# Patient Record
Sex: Female | Born: 1990
Health system: Southern US, Community
[De-identification: ages and names within clinical notes are randomized; demographics above are authoritative.]

## PROBLEM LIST (undated history)

## (undated) ENCOUNTER — Inpatient Hospital Stay: Payer: Self-pay

## (undated) DIAGNOSIS — N83209 Unspecified ovarian cyst, unspecified side: Secondary | ICD-10-CM

## (undated) DIAGNOSIS — Z1371 Encounter for nonprocreative screening for genetic disease carrier status: Secondary | ICD-10-CM

## (undated) DIAGNOSIS — N809 Endometriosis, unspecified: Secondary | ICD-10-CM

## (undated) HISTORY — PX: IUD REMOVAL: SHX5392

## (undated) HISTORY — DX: Encounter for nonprocreative screening for genetic disease carrier status: Z13.71

---
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
## 2009-12-11 ENCOUNTER — Emergency Department: Payer: Self-pay | Admitting: Emergency Medicine

## 2009-12-29 ENCOUNTER — Emergency Department: Payer: Self-pay | Admitting: Internal Medicine

## 2010-05-13 ENCOUNTER — Emergency Department: Payer: Self-pay | Admitting: Emergency Medicine

## 2010-09-17 ENCOUNTER — Emergency Department: Payer: Self-pay | Admitting: Emergency Medicine

## 2010-09-18 ENCOUNTER — Emergency Department: Payer: Self-pay | Admitting: Emergency Medicine

## 2011-01-23 ENCOUNTER — Emergency Department: Payer: Self-pay | Admitting: Emergency Medicine

## 2011-03-28 ENCOUNTER — Emergency Department: Payer: Self-pay | Admitting: *Deleted

## 2011-04-23 ENCOUNTER — Emergency Department: Payer: Self-pay | Admitting: Emergency Medicine

## 2011-04-23 ENCOUNTER — Emergency Department: Payer: Self-pay

## 2011-06-17 ENCOUNTER — Emergency Department: Payer: Self-pay | Admitting: Emergency Medicine

## 2011-06-24 ENCOUNTER — Inpatient Hospital Stay: Payer: Self-pay | Admitting: Internal Medicine

## 2011-09-10 ENCOUNTER — Emergency Department: Payer: Self-pay

## 2011-09-10 LAB — URINALYSIS, COMPLETE
Bilirubin,UR: NEGATIVE
Glucose,UR: NEGATIVE mg/dL (ref 0–75)
Leukocyte Esterase: NEGATIVE
Ph: 7 (ref 4.5–8.0)
Protein: NEGATIVE
RBC,UR: 1 /HPF (ref 0–5)
Squamous Epithelial: 3

## 2011-09-10 LAB — CBC
HCT: 37.3 % (ref 35.0–47.0)
HGB: 12.4 g/dL (ref 12.0–16.0)
MCH: 30.3 pg (ref 26.0–34.0)
MCHC: 33.4 g/dL (ref 32.0–36.0)
MCV: 91 fL (ref 80–100)
Platelet: 361 10*3/uL (ref 150–440)
RBC: 4.11 10*6/uL (ref 3.80–5.20)
RDW: 13.6 % (ref 11.5–14.5)
WBC: 7.3 10*3/uL (ref 3.6–11.0)

## 2011-09-10 LAB — COMPREHENSIVE METABOLIC PANEL
Albumin: 3.8 g/dL (ref 3.4–5.0)
Alkaline Phosphatase: 36 U/L — ABNORMAL LOW (ref 50–136)
Anion Gap: 12 (ref 7–16)
BUN: 5 mg/dL — ABNORMAL LOW (ref 7–18)
Bilirubin,Total: 0.5 mg/dL (ref 0.2–1.0)
Calcium, Total: 8.9 mg/dL (ref 8.5–10.1)
Chloride: 105 mmol/L (ref 98–107)
Co2: 24 mmol/L (ref 21–32)
Creatinine: 0.69 mg/dL (ref 0.60–1.30)
EGFR (African American): >60
EGFR (Non-African Amer.): >60
Glucose: 81 mg/dL (ref 65–99)
Osmolality: 278 (ref 275–301)
Potassium: 3.7 mmol/L (ref 3.5–5.1)
SGOT(AST): 22 U/L (ref 15–37)
SGPT (ALT): 15 U/L
Sodium: 141 mmol/L (ref 136–145)
Total Protein: 7.4 g/dL (ref 6.4–8.2)

## 2011-09-10 LAB — HCG, QUANTITATIVE, PREGNANCY: Beta Hcg, Quant.: 17056 m[IU]/mL — ABNORMAL HIGH

## 2011-09-10 LAB — WET PREP, GENITAL

## 2012-04-25 ENCOUNTER — Inpatient Hospital Stay: Payer: Self-pay | Admitting: Obstetrics and Gynecology

## 2012-04-25 LAB — CBC WITH DIFFERENTIAL/PLATELET
Basophil #: 0 10*3/uL (ref 0.0–0.1)
Eosinophil %: 0.7 %
HCT: 37.3 % (ref 35.0–47.0)
HGB: 12.5 g/dL (ref 12.0–16.0)
Lymphocyte %: 23.6 %
MCV: 90 fL (ref 80–100)
Monocyte #: 0.7 x10 3/mm (ref 0.2–0.9)
Monocyte %: 6.9 %
Neutrophil %: 68.4 %
RBC: 4.14 10*6/uL (ref 3.80–5.20)
RDW: 13.3 % (ref 11.5–14.5)
WBC: 9.5 10*3/uL (ref 3.6–11.0)

## 2012-04-26 LAB — HEMATOCRIT: HCT: 31.5 % — ABNORMAL LOW (ref 35.0–47.0)

## 2013-07-21 HISTORY — PX: OTHER SURGICAL HISTORY: SHX169

## 2013-07-23 ENCOUNTER — Emergency Department: Payer: Self-pay | Admitting: Emergency Medicine

## 2013-07-23 LAB — URINALYSIS, COMPLETE
Bilirubin,UR: NEGATIVE
Blood: NEGATIVE
GLUCOSE, UR: NEGATIVE mg/dL (ref 0–75)
Ketone: NEGATIVE
NITRITE: NEGATIVE
Ph: 6 (ref 4.5–8.0)
Specific Gravity: 1.015 (ref 1.003–1.030)

## 2013-07-23 LAB — WET PREP, GENITAL

## 2013-07-23 LAB — CBC WITH DIFFERENTIAL/PLATELET
BASOS ABS: 0 10*3/uL (ref 0.0–0.1)
Basophil %: 0.3 %
EOS ABS: 0.1 10*3/uL (ref 0.0–0.7)
Eosinophil %: 0.9 %
HCT: 38.6 % (ref 35.0–47.0)
HGB: 12.7 g/dL (ref 12.0–16.0)
LYMPHS ABS: 2.3 10*3/uL (ref 1.0–3.6)
LYMPHS PCT: 20 %
MCH: 28.8 pg (ref 26.0–34.0)
MCHC: 32.8 g/dL (ref 32.0–36.0)
MCV: 88 fL (ref 80–100)
MONO ABS: 0.7 x10 3/mm (ref 0.2–0.9)
Monocyte %: 6 %
NEUTROS PCT: 72.8 %
Neutrophil #: 8.3 10*3/uL — ABNORMAL HIGH (ref 1.4–6.5)
Platelet: 342 10*3/uL (ref 150–440)
RBC: 4.4 10*6/uL (ref 3.80–5.20)
RDW: 14.1 % (ref 11.5–14.5)
WBC: 11.4 10*3/uL — ABNORMAL HIGH (ref 3.6–11.0)

## 2013-07-23 LAB — COMPREHENSIVE METABOLIC PANEL
ALBUMIN: 3.8 g/dL (ref 3.4–5.0)
ALK PHOS: 55 U/L
Anion Gap: 4 — ABNORMAL LOW (ref 7–16)
BUN: 10 mg/dL (ref 7–18)
Bilirubin,Total: 0.4 mg/dL (ref 0.2–1.0)
CALCIUM: 9.1 mg/dL (ref 8.5–10.1)
Chloride: 105 mmol/L (ref 98–107)
Co2: 27 mmol/L (ref 21–32)
Creatinine: 0.94 mg/dL (ref 0.60–1.30)
EGFR (African American): >60
GLUCOSE: 81 mg/dL (ref 65–99)
OSMOLALITY: 270 (ref 275–301)
Potassium: 3.8 mmol/L (ref 3.5–5.1)
SGOT(AST): 26 U/L (ref 15–37)
SGPT (ALT): 20 U/L (ref 12–78)
Sodium: 136 mmol/L (ref 136–145)
Total Protein: 7.5 g/dL (ref 6.4–8.2)

## 2013-07-23 LAB — GC/CHLAMYDIA PROBE AMP

## 2013-08-17 ENCOUNTER — Ambulatory Visit: Payer: Self-pay | Admitting: Obstetrics and Gynecology

## 2013-08-25 ENCOUNTER — Ambulatory Visit: Payer: Self-pay | Admitting: Obstetrics and Gynecology

## 2013-12-25 ENCOUNTER — Encounter (HOSPITAL_COMMUNITY): Payer: Self-pay | Admitting: Emergency Medicine

## 2013-12-25 ENCOUNTER — Emergency Department: Payer: Self-pay | Admitting: Internal Medicine

## 2013-12-25 ENCOUNTER — Emergency Department (HOSPITAL_COMMUNITY)
Admission: EM | Admit: 2013-12-25 | Discharge: 2013-12-25 | Disposition: A | Discharge status: Home / Self Care | Payer: No Typology Code available for payment source | Attending: Emergency Medicine | Admitting: Emergency Medicine

## 2013-12-25 DIAGNOSIS — R102 Pelvic and perineal pain: Secondary | ICD-10-CM

## 2013-12-25 DIAGNOSIS — Z3202 Encounter for pregnancy test, result negative: Secondary | ICD-10-CM | POA: Insufficient documentation

## 2013-12-25 DIAGNOSIS — N949 Unspecified condition associated with female genital organs and menstrual cycle: Secondary | ICD-10-CM | POA: Insufficient documentation

## 2013-12-25 DIAGNOSIS — Z880 Allergy status to penicillin: Secondary | ICD-10-CM | POA: Insufficient documentation

## 2013-12-25 DIAGNOSIS — F17200 Nicotine dependence, unspecified, uncomplicated: Secondary | ICD-10-CM | POA: Insufficient documentation

## 2013-12-25 HISTORY — DX: Endometriosis, unspecified: N80.9

## 2013-12-25 HISTORY — DX: Unspecified ovarian cyst, unspecified side: N83.209

## 2013-12-25 LAB — COMPREHENSIVE METABOLIC PANEL
ANION GAP: 7 (ref 7–16)
AST: 24 U/L (ref 15–37)
Albumin: 3.4 g/dL (ref 3.4–5.0)
Alkaline Phosphatase: 48 U/L
BUN: 13 mg/dL (ref 7–18)
Bilirubin,Total: 0.3 mg/dL (ref 0.2–1.0)
Calcium, Total: 8.8 mg/dL (ref 8.5–10.1)
Chloride: 108 mmol/L — ABNORMAL HIGH (ref 98–107)
Co2: 26 mmol/L (ref 21–32)
Creatinine: 0.92 mg/dL (ref 0.60–1.30)
EGFR (African American): >60
Glucose: 98 mg/dL (ref 65–99)
Osmolality: 281 (ref 275–301)
Potassium: 3.7 mmol/L (ref 3.5–5.1)
SGPT (ALT): 18 U/L (ref 12–78)
SODIUM: 141 mmol/L (ref 136–145)
Total Protein: 7 g/dL (ref 6.4–8.2)

## 2013-12-25 LAB — URINALYSIS, ROUTINE W REFLEX MICROSCOPIC
Bilirubin Urine: NEGATIVE
GLUCOSE, UA: NEGATIVE mg/dL
Hgb urine dipstick: NEGATIVE
Ketones, ur: NEGATIVE mg/dL
LEUKOCYTES UA: NEGATIVE
NITRITE: NEGATIVE
PH: 6 (ref 5.0–8.0)
Protein, ur: NEGATIVE mg/dL
SPECIFIC GRAVITY, URINE: 1.021 (ref 1.005–1.030)
Urobilinogen, UA: 0.2 mg/dL (ref 0.0–1.0)

## 2013-12-25 LAB — CBC WITH DIFFERENTIAL/PLATELET
Basophil #: 0.1 10*3/uL (ref 0.0–0.1)
Basophil %: 0.7 %
EOS ABS: 0.1 10*3/uL (ref 0.0–0.7)
EOS PCT: 1.6 %
HCT: 37.4 % (ref 35.0–47.0)
HGB: 12.1 g/dL (ref 12.0–16.0)
LYMPHS PCT: 34.7 %
Lymphocyte #: 2.9 10*3/uL (ref 1.0–3.6)
MCH: 29.3 pg (ref 26.0–34.0)
MCHC: 32.4 g/dL (ref 32.0–36.0)
MCV: 90 fL (ref 80–100)
Monocyte #: 0.6 x10 3/mm (ref 0.2–0.9)
Monocyte %: 6.8 %
Neutrophil #: 4.7 10*3/uL (ref 1.4–6.5)
Neutrophil %: 56.2 %
PLATELETS: 342 10*3/uL (ref 150–440)
RBC: 4.14 10*6/uL (ref 3.80–5.20)
RDW: 13.7 % (ref 11.5–14.5)
WBC: 8.4 10*3/uL (ref 3.6–11.0)

## 2013-12-25 LAB — WET PREP, GENITAL
CLUE CELLS WET PREP: NONE SEEN
Trich, Wet Prep: NONE SEEN
WBC, Wet Prep HPF POC: NONE SEEN
Yeast Wet Prep HPF POC: NONE SEEN

## 2013-12-25 LAB — PREGNANCY, URINE: PREG TEST UR: NEGATIVE

## 2013-12-25 LAB — TROPONIN I: Troponin-I: <0.02 ng/mL

## 2013-12-25 MED ORDER — OXYCODONE-ACETAMINOPHEN 5-325 MG PO TABS: 1.0000 | ORAL_TABLET | ORAL | Status: DC | PRN

## 2013-12-25 NOTE — ED Notes (Signed)
Pt reports lower abd.pain, that gets worse with intercourse; pt sts hx of endometriosis,, reports pain feels like contractions. Pt was at [facility] and had blood work done, but couldn't wait any longer.

## 2013-12-25 NOTE — ED Provider Notes (Signed)
CSN: [id number]     Arrival date & time 12/25/13  1645 History   First MD Initiated Contact with Patient 12/25/13 1721     Chief Complaint  Patient presents with  . Abdominal Pain     (Consider location/radiation/quality/duration/timing/severity/associated sxs/prior Treatment) Patient is a 23 y.o. female presenting with abdominal pain. The history is provided by the patient. No language interpreter was used.  Abdominal Pain Pain location:  Suprapubic Pain quality: sharp   Pain radiates to:  Does not radiate Associated symptoms: no chills, no dysuria, no fever, no nausea, no vaginal bleeding, no vaginal discharge and no vomiting   Associated symptoms comment:  Patient with 3 year history of recurrent pelvic pain, negative for endometriosis on laparoscopy, with worsened pain for the past 3 days. No vaginal discharge, abnormal bleeding, fever, or dysuria. She has a GYN in [place] that is managing chronic discomfort.    Past Medical History  Diagnosis Date  . Endometriosis   . Ovarian cyst    Past Surgical History  Procedure Laterality Date  . Iud removal     No family history on file. History  Substance Use Topics  . Smoking status: Current Every Day Smoker -- 0.50 packs/day    Types: Cigarettes  . Smokeless tobacco: Not on file  . Alcohol Use: Yes     Comment: occ   OB History   Grav Para Term Preterm Abortions TAB SAB Ect Mult Living                 Review of Systems  Constitutional: Negative for fever and chills.  Gastrointestinal: Positive for abdominal pain. Negative for nausea and vomiting.  Genitourinary: Positive for pelvic pain. Negative for dysuria, flank pain, vaginal bleeding, vaginal discharge and menstrual problem.  Musculoskeletal: Negative.   Skin: Negative.       Allergies  Penicillins  Home Medications   Prior to Admission medications   Medication Sig Start Date End Date Taking? Authorizing Provider  ibuprofen (ADVIL,MOTRIN) 200 MG tablet  Take 200 mg by mouth every 6 (six) hours as needed for mild pain.   Yes Historical Provider, MD   BP 132/82  Pulse 79  Temp(Src) 98.6 F (37 C) (Oral)  Resp 18  SpO2 100%  LMP 12/14/2013 Physical Exam  Constitutional: She is oriented to person, place, and time. She appears well-developed and well-nourished.  HENT:  Head: Normocephalic.  Neck: Normal range of motion. Neck supple.  Pulmonary/Chest: Effort normal.  Abdominal: Soft. Bowel sounds are normal. There is no tenderness. There is no rebound and no guarding.  Genitourinary: Vagina normal and uterus normal. No vaginal discharge found.  No adnexal mass. There is pelvic tenderness throughout. Cervix is unremarkable in appearance.   Musculoskeletal: Normal range of motion.  Neurological: She is alert and oriented to person, place, and time.  Skin: Skin is warm and dry. No rash noted.  Psychiatric: She has a normal mood and affect.    ED Course  Procedures (including critical care time) Labs Review Labs Reviewed  WET PREP, GENITAL  GC/CHLAMYDIA PROBE AMP  URINALYSIS, ROUTINE W REFLEX MICROSCOPIC  PREGNANCY, URINE   Results for orders placed during the hospital encounter of 12/25/13  WET PREP, GENITAL      Result Value Ref Range   Yeast Wet Prep HPF POC NONE SEEN  NONE SEEN   Trich, Wet Prep NONE SEEN  NONE SEEN   Clue Cells Wet Prep HPF POC NONE SEEN  NONE SEEN   WBC, Wet  Prep HPF POC NONE SEEN  NONE SEEN  URINALYSIS, ROUTINE W REFLEX MICROSCOPIC      Result Value Ref Range   Color, Urine YELLOW  YELLOW   APPearance CLEAR  CLEAR   Specific Gravity, Urine 1.021  1.005 - 1.030   pH 6.0  5.0 - 8.0   Glucose, UA NEGATIVE  NEGATIVE mg/dL   Hgb urine dipstick NEGATIVE  NEGATIVE   Bilirubin Urine NEGATIVE  NEGATIVE   Ketones, ur NEGATIVE  NEGATIVE mg/dL   Protein, ur NEGATIVE  NEGATIVE mg/dL   Urobilinogen, UA 0.2  0.0 - 1.0 mg/dL   Nitrite NEGATIVE  NEGATIVE   Leukocytes, UA NEGATIVE  NEGATIVE  PREGNANCY, URINE       Result Value Ref Range   Preg Test, Ur NEGATIVE  NEGATIVE     Imaging Review No results found.   EKG Interpretation None      MDM   Final diagnoses:  None    1. Recurrent pelvic pain  No fever, vaginal discharge concerning for infection, or evidence other infection. Patient is not pregnant. Suspect recurrent of recurrent/chronic pelvic pain.     Arnoldo HookerShari A Hosanna Betley, PA-C 12/25/13 1942

## 2013-12-25 NOTE — Discharge Instructions (Signed)
Pelvic Pain, Female °Female pelvic pain can be caused by many different things and start from a variety of places. Pelvic pain refers to pain that is located in the lower half of the abdomen and between your hips. The pain may occur over a short period of time (acute) or may be reoccurring (chronic). The cause of pelvic pain may be related to disorders affecting the female reproductive organs (gynecologic), but it may also be related to the bladder, kidney stones, an intestinal complication, or muscle or skeletal problems. Getting help right away for pelvic pain is important, especially if there has been severe, sharp, or a sudden onset of unusual pain. It is also important to get help right away because some types of pelvic pain can be life threatening.  °CAUSES  °Below are only some of the causes of pelvic pain. The causes of pelvic pain can be in one of several categories.  °· Gynecologic. °· Pelvic inflammatory disease. °· Sexually transmitted infection. °· Ovarian cyst or a twisted ovarian ligament (ovarian torsion). °· Uterine lining that grows outside the uterus (endometriosis). °· Fibroids, cysts, or tumors. °· Ovulation. °· Pregnancy. °· Pregnancy that occurs outside the uterus (ectopic pregnancy). °· Miscarriage. °· Labor. °· Abruption of the placenta or ruptured uterus. °· Infection. °· Uterine infection (endometritis). °· Bladder infection. °· Diverticulitis. °· Miscarriage related to a uterine infection (septic abortion). °· Bladder. °· Inflammation of the bladder (cystitis). °· Kidney stone(s). °· Gastrointenstinal. °· Constipation. °· Diverticulitis. °· Neurologic. °· Trauma. °· Feeling pelvic pain because of mental or emotional causes (psychosomatic). °· Cancers of the bowel or pelvis. °EVALUATION  °Your caregiver will want to take a careful history of your concerns. This includes recent changes in your health, a careful gynecologic history of your periods (menses), and a sexual history. Obtaining  your family history and medical history is also important. Your caregiver may suggest a pelvic exam. A pelvic exam will help identify the location and severity of the pain. It also helps in the evaluation of which organ system may be involved. In order to identify the cause of the pelvic pain and be properly treated, your caregiver may order tests. These tests may include:  °· A pregnancy test. °· Pelvic ultrasonography. °· An X-ray exam of the abdomen. °· A urinalysis or evaluation of vaginal discharge. °· Blood tests. °HOME CARE INSTRUCTIONS  °· Only take over-the-counter or prescription medicines for pain, discomfort, or fever as directed by your caregiver.   °· Rest as directed by your caregiver.   °· Eat a balanced diet.   °· Drink enough fluids to make your urine clear or pale yellow, or as directed.   °· Avoid sexual intercourse if it causes pain.   °· Apply warm or cold compresses to the lower abdomen depending on which one helps the pain.   °· Avoid stressful situations.   °· Keep a journal of your pelvic pain. Write down when it started, where the pain is located, and if there are things that seem to be associated with the pain, such as food or your menstrual cycle. °· Follow up with your caregiver as directed.   °SEEK MEDICAL CARE IF: °· Your medicine does not help your pain. °· You have abnormal vaginal discharge. °SEEK IMMEDIATE MEDICAL CARE IF:  °· You have heavy bleeding from the vagina.   °· Your pelvic pain increases.   °· You feel lightheaded or faint.   °· You have chills.   °· You have pain with urination or blood in your urine.   °· You have uncontrolled   diarrhea or vomiting.   °· You have a fever or persistent symptoms for more than 3 days. °· You have a fever and your symptoms suddenly get worse.   °· You are being physically or sexually abused.   °MAKE SURE YOU: °· Understand these instructions. °· Will watch your condition. °· Will get help if you are not doing well or get worse. °Document  Released: 06/03/2004 Document Revised: 01/06/2012 Document Reviewed: 10/27/2011 °Exit[employer]® Patient Information ©2014 [employer]. ° °

## 2013-12-25 NOTE — ED Provider Notes (Signed)
Medical screening examination/treatment/procedure(s) were performed by non-physician practitioner and as supervising physician I was immediately available for consultation/collaboration.    Linwood Dibbles, MD 12/25/13 2001

## 2013-12-26 LAB — GC/CHLAMYDIA PROBE AMP
CT Probe RNA: NEGATIVE
GC Probe RNA: NEGATIVE

## 2014-03-24 ENCOUNTER — Emergency Department: Payer: Self-pay | Admitting: Emergency Medicine

## 2014-05-23 ENCOUNTER — Emergency Department: Payer: Self-pay | Admitting: Emergency Medicine

## 2014-05-25 LAB — BETA STREP CULTURE(ARMC)

## 2014-06-13 ENCOUNTER — Emergency Department: Payer: Self-pay | Admitting: Emergency Medicine

## 2014-06-13 LAB — CBC
HCT: 41.5 % (ref 35.0–47.0)
HGB: 13.8 g/dL (ref 12.0–16.0)
MCH: 29.9 pg (ref 26.0–34.0)
MCHC: 33.4 g/dL (ref 32.0–36.0)
MCV: 90 fL (ref 80–100)
PLATELETS: 404 10*3/uL (ref 150–440)
RBC: 4.63 10*6/uL (ref 3.80–5.20)
RDW: 12.9 % (ref 11.5–14.5)
WBC: 7 10*3/uL (ref 3.6–11.0)

## 2014-06-13 LAB — HCG, QUANTITATIVE, PREGNANCY: Beta Hcg, Quant.: 302 m[IU]/mL — ABNORMAL HIGH

## 2014-07-21 NOTE — L&D Delivery Note (Signed)
Delivery Note At 7:07 AM a viable female infant was delivered via Vaginal, Spontaneous Delivery (Presentation: Right Occiput Anterior).  APGAR: 8, 9; weight 6 lb 2.1 oz (2780 g).   Placenta status: Intact, Spontaneous.  Cord: 3 vessels with the following complications: None.    Anesthesia: None  Episiotomy: None Lacerations: None Suture Repair: n/a Est. Blood Loss (mL): 350  Mom to postpartum.  Baby to SCN.  Called to see patient.  Mom pushed to delivery viable female infant.  The head followed by shoulders, which delivered without difficulty, and the rest of the body.  No nuchal cord noted.  Baby to mom's chest.  Cord clamped and cut after > 1 min delay.  No cord blood obtained.  Placenta delivered spontaneously, intact, with a 3-vessel cord.  Small lacerations noted and were hemostatic without repair.  All counts correct.  Hemostasis obtained with IV pitocin and fundal massage. EBL 350 mL.     Conard NovakJackson, Crystie Yanko D, MD 06/20/2015, 7:29 AM

## 2014-08-27 ENCOUNTER — Emergency Department: Payer: Self-pay | Admitting: Emergency Medicine

## 2014-08-27 LAB — COMPREHENSIVE METABOLIC PANEL
ALT: 24 U/L (ref 14–63)
Albumin: 3.4 g/dL (ref 3.4–5.0)
Alkaline Phosphatase: 58 U/L (ref 46–116)
Anion Gap: 6 — ABNORMAL LOW (ref 7–16)
BUN: 11 mg/dL (ref 7–18)
Bilirubin,Total: 0.5 mg/dL (ref 0.2–1.0)
CALCIUM: 9 mg/dL (ref 8.5–10.1)
CO2: 24 mmol/L (ref 21–32)
CREATININE: 0.91 mg/dL (ref 0.60–1.30)
Chloride: 110 mmol/L — ABNORMAL HIGH (ref 98–107)
EGFR (African American): >60
Glucose: 83 mg/dL (ref 65–99)
Osmolality: 278 (ref 275–301)
Potassium: 3.9 mmol/L (ref 3.5–5.1)
SGOT(AST): 22 U/L (ref 15–37)
Sodium: 140 mmol/L (ref 136–145)
Total Protein: 7.4 g/dL (ref 6.4–8.2)

## 2014-08-27 LAB — URINALYSIS, COMPLETE
BACTERIA: NONE SEEN
BLOOD: NEGATIVE
Bilirubin,UR: NEGATIVE
GLUCOSE, UR: NEGATIVE mg/dL (ref 0–75)
Ketone: NEGATIVE
LEUKOCYTE ESTERASE: NEGATIVE
Nitrite: NEGATIVE
Ph: 7 (ref 4.5–8.0)
Protein: NEGATIVE
Specific Gravity: 1.018 (ref 1.003–1.030)
Squamous Epithelial: 2
WBC UR: 1 /HPF (ref 0–5)

## 2014-08-27 LAB — CBC WITH DIFFERENTIAL/PLATELET
BASOS PCT: 0.9 %
Basophil #: 0 10*3/uL (ref 0.0–0.1)
EOS ABS: 0.1 10*3/uL (ref 0.0–0.7)
Eosinophil %: 1.6 %
HCT: 40.4 % (ref 35.0–47.0)
HGB: 13 g/dL (ref 12.0–16.0)
Lymphocyte #: 2.6 10*3/uL (ref 1.0–3.6)
Lymphocyte %: 46.4 %
MCH: 28.8 pg (ref 26.0–34.0)
MCHC: 32.2 g/dL (ref 32.0–36.0)
MCV: 90 fL (ref 80–100)
Monocyte #: 0.5 x10 3/mm (ref 0.2–0.9)
Monocyte %: 8.4 %
NEUTROS ABS: 2.4 10*3/uL (ref 1.4–6.5)
Neutrophil %: 42.7 %
Platelet: 350 10*3/uL (ref 150–440)
RBC: 4.52 10*6/uL (ref 3.80–5.20)
RDW: 13.3 % (ref 11.5–14.5)
WBC: 5.5 10*3/uL (ref 3.6–11.0)

## 2014-08-27 LAB — LIPASE, BLOOD: Lipase: 199 U/L (ref 73–393)

## 2014-08-27 LAB — PREGNANCY, URINE: Pregnancy Test, Urine: NEGATIVE m[IU]/mL

## 2014-10-03 ENCOUNTER — Emergency Department: Payer: Self-pay | Admitting: Emergency Medicine

## 2014-11-11 NOTE — Consult Note (Signed)
Brief Consult Note: Diagnosis: Simple left ovarian cyst, partially perforating Mirena IUD (successfully removed).   Patient was seen by consultant.   Consult note dictated.   Discussed with Attending MD.   Comments: Follow up with myself 07/26/2013 at 15:30 [facility] [place] [facility].  Electronic Signatures: Lorrene ReidStaebler, Huxton Glaus M (MD)  (Signed 03-Jan-15 19:31)  Authored: Brief Consult Note   Last Updated: 03-Jan-15 19:31 by Lorrene ReidStaebler, Brandom Kerwin M (MD)

## 2014-11-11 NOTE — Op Note (Signed)
PATIENT NAME:  Abigail [name], Abigail [name] MR#:  [id number] DATE OF BIRTH:  1991-05-18  DATE OF PROCEDURE:  08/25/2013  PREOPERATIVE DIAGNOSIS: Abdominal pain and left ovarian cyst.   POSTOPERATIVE DIAGNOSIS: Resolution of left ovarian cyst with normal intra-abdominal anatomy.   OPERATION PERFORMED: Diagnostic laparoscopy.   PRIMARY SURGEON: Vena AustriaAndreas Maral Lampe, MD  ANESTHESIA USED: GENERAL  ESTIMATED BLOOD LOSS: Minimal.   OPERATIVE FLUIDS: 800 mL of crystalloid.   URINE OUTPUT: 50 mL of clear urine.   DRAINS OR TUBES: None.   IMPLANTS: None.   PREOPERATIVE ANTIBIOTICS: None.   COMPLICATIONS: None.   INTRAOPERATIVE FINDINGS: Normal tubes, ovaries, anterior and posterior cul-de-sac, normal ureters, retrocecal appendix, normal liver. No evidence of hernia.   SPECIMENS REMOVED: None.   PATIENT CONDITION FOLLOWING PROCEDURE: Stable.   PROCEDURE IN DETAIL: Risks, benefits, and alternatives of the procedure were discussed with the patient prior to proceeding to the operating room. The patient was taken to the operating room where she was placed under general endotracheal anesthesia. She was positioned in the dorsal lithotomy position using Allen stirrups, prepped and draped in the usual sterile fashion. A timeout procedure was performed. Attention was then turned to the patient's pelvis. A red rubber catheter was used to straight cath the patient's bladder. An operative speculum was then placed. The anterior lip of the cervix was visualized, grasped with a single-tooth tenaculum, and a Hulka tenaculum was placed for uterine manipulation. The single-tooth tenaculum and operative speculum were removed. Attention was turned to the patient's abdomen, and the umbilicus was infiltrated with 1% plain lidocaine. A stab incision was made at the base of the umbilicus and entry into the abdominal cavity was accomplished using direct visualization and a  5 mm XL trocar. Once entry into the abdominal cavity had  been accomplished, pneumoperitoneum was established. A single left 5 mm assistant port was placed under direct visualization. Inspection of the abdomen and pelvis revealed the above findings. There was no etiology visualized to explain the patient's pelvic pain. The pneumoperitoneum was then evacuated. The trocars were removed and the trocar sites dressed with Dermabond. The Hulka tenaculum was removed as well. Sponge, needle, and instrument counts were correct x2. The patient tolerated the procedure well and was taken to the recovery room in stable condition.   ____________________________ Florina OuAndreas M. Bonney AidStaebler, MD ams:sb [name]: 08/25/2013 16:07:39 ET T: 08/25/2013 17:12:42 ET JOB#: [id number]  cc: Florina OuAndreas M. Bonney AidStaebler, MD, <Dictator> Carmel SacramentoANDREAS Cathrine MusterM Gaspare Netzel MD ELECTRONICALLY SIGNED 09/07/2013 0:59

## 2014-11-11 NOTE — Consult Note (Signed)
Consulting Service: Emergency DepartmentPhysician: Darnelle CatalanMalinda, MD Renae FicklePaul Question: Abdominal pain, malpositioned IUD of Present Illness: The patient is a 24 year old G2P1011 presenting with a 2 month history of abdominal pain.  The patient underwent Mirena IUD placement by Dr. Jule SerWever Lee on 06/23/2012.  [facility] and was told the IUD was in proper location although the IUD strings were not visualized by the provider.  No imaging was obtained at that time.  She was treated for cystitis.  Up until 2 months ago the patient had not experienced any problems with her IUD.  She had light regular menses, no cramping or abdominal pain.  The patient states she has been able to feel her strings as recently as 2 weeks ago.  The pain she reports initially started out LLQ but then switched to right lower quadrant is currently more midline.  She does reports some pain/discomfort with voiding no hematuria or leakage of urine. of systems: 10 point reveiw of sytems negative unless otherwise noted in HPI Medical History: healthy adult Surgical History: Wisdom tooth extraction Obstetric History: G2P1011SAB10/12/2011 TSVD, female, 5lbs 15oz History: denies history of STI, no history of abnormal paps History: Non-contributory History: Denies tobacco, ETOH, or illicit drug use PCN none Exam: .  Pulse Pulse : 80  Respirations Respirations : 20  SBP SBP : 116  DBP DBP : 72  Pulse Ox % Pulse Ox % : 99 % Pulse Ox Source Source : Room Air Pain Scale (0-10) Pain Scale (0-10) : Scale:0 NADanicteric, normocephalicRRRCTABNABS, soft, non-tender, non-distendedNormal external femal genitalia, normal urethra, normal bartholin & skeenes glands.  Normal vaginal mucosa.  Cervix normal, IUD strings visualized.  Uterus normal size and retroverted.  The IUD strings are grasped with a polyp forceps and the IUD was removed without difficulty.  No excessive amount of traction was needed whc might suggest  the IUD was deeply embedded in the myometrium.  The IUD was inspected noted to be intact.  There was no bleeding pre or post procedure.no edema Abdomen/Pelvic personaly reviewed.  There appears to be a partial anterior perforation of the IUD anteriorly in the lower uterine segment..  On arm appears to be through the serosal surface of the IUD but there appears to be no involvement or errosion into the bladder. There is a simple 3cm left ovarian cyst  UA, CMP, gonorrhea & chlamydia cultures reviewed Mirena IUD with uterine perforationLeft ovarian simple cyst managment option including operative removal vs removal at bedside.  Bedside removal was successfull.  I will have the patien tfollow up in cliniic for a contracpeiton consult and to see how she is doing post removal.  If she continues to have urinary symptoms I did discuss obtianting a cystoscopy.  The left ovarian cyst is simple in appearance and based on size required no furhte rfollow up however if pain persists would recommend follow up ultrasound in 6-8 weeks.  Arranged follow up with myself on 07/26/2012 at 15:30.  Electronic Signatures: Lorrene ReidStaebler, Erik Burkett M (MD)  (Signed on 03-Jan-15 20:38)  Authored  Last Updated: 03-Jan-15 20:38 by Lorrene ReidStaebler, Archana Eckman M (MD)

## 2014-11-13 LAB — HM PAP SMEAR: HM PAP: NEGATIVE

## 2014-11-23 LAB — OB RESULTS CONSOLE RPR: RPR: NONREACTIVE

## 2014-11-23 LAB — OB RESULTS CONSOLE RUBELLA ANTIBODY, IGM: Rubella: IMMUNE

## 2014-11-23 LAB — OB RESULTS CONSOLE ABO/RH: RH TYPE: POSITIVE

## 2014-11-23 LAB — OB RESULTS CONSOLE GC/CHLAMYDIA
Chlamydia: NEGATIVE
GC PROBE AMP, GENITAL: NEGATIVE

## 2014-11-23 LAB — OB RESULTS CONSOLE HEPATITIS B SURFACE ANTIGEN: Hepatitis B Surface Ag: NEGATIVE

## 2014-11-23 LAB — OB RESULTS CONSOLE HIV ANTIBODY (ROUTINE TESTING): HIV: NONREACTIVE

## 2014-11-23 LAB — OB RESULTS CONSOLE VARICELLA ZOSTER ANTIBODY, IGG: Varicella: IMMUNE

## 2014-11-28 NOTE — H&P (Signed)
L&D Evaluation:  History:   H[facility] 24 [facility] with SROM 1 hr prior to arriving.  PNC @ [facility] transferred from [facility] @ 11wks.  Preg c/b chlamydia.  Now having irregular contractions.    Presents with contractions    Patient's Medical History No Chronic Illness    Patient's Surgical History none    Medications Pre Natal Vitamins    Allergies PCN    Social History tobacco    Family History HTN   Exam:   Vital Signs stable    General no apparent distress    Mental Status clear    Chest clear    Heart normal sinus rhythm    Abdomen gravid, tender with contractions    Fetal Position Vertex    Edema no edema    Pelvic 1cm per RN.  Foley bulb placed on bag tension.    Mebranes Ruptured    Description clear    FHT normal rate with no decels    Fetal Heart Rate 135    Ucx irregular    Skin dry    Lymph no lymphadenopathy   Impression:   Impression SROM at term   Plan:   Plan EFM/NST, monitor contractions and for cervical change, Wants epidural when able.  IV analgesia for now.   Electronic Signatures: Senaida LangeWeaver-Lee, Jamesetta Greenhalgh (MD)  (Signed 06-Oct-13 12:10)  Authored: L&D Evaluation   Last Updated: 06-Oct-13 12:10 by Senaida LangeWeaver-Lee, Eline Geng (MD)

## 2014-12-05 ENCOUNTER — Encounter: Payer: Self-pay | Admitting: Emergency Medicine

## 2014-12-05 ENCOUNTER — Emergency Department
Admission: EM | Admit: 2014-12-05 | Discharge: 2014-12-05 | Disposition: A | Discharge status: Home / Self Care | Payer: Medicaid Other | Attending: Emergency Medicine | Admitting: Emergency Medicine

## 2014-12-05 DIAGNOSIS — O219 Vomiting of pregnancy, unspecified: Secondary | ICD-10-CM

## 2014-12-05 DIAGNOSIS — Z880 Allergy status to penicillin: Secondary | ICD-10-CM | POA: Diagnosis not present

## 2014-12-05 DIAGNOSIS — Z3A01 Less than 8 weeks gestation of pregnancy: Secondary | ICD-10-CM | POA: Diagnosis not present

## 2014-12-05 DIAGNOSIS — F17210 Nicotine dependence, cigarettes, uncomplicated: Secondary | ICD-10-CM | POA: Insufficient documentation

## 2014-12-05 DIAGNOSIS — O99331 Smoking (tobacco) complicating pregnancy, first trimester: Secondary | ICD-10-CM | POA: Insufficient documentation

## 2014-12-05 DIAGNOSIS — O210 Mild hyperemesis gravidarum: Secondary | ICD-10-CM | POA: Insufficient documentation

## 2014-12-05 MED ORDER — SODIUM CHLORIDE 0.9 % IV BOLUS (SEPSIS)
1000.0000 mL | Freq: Once | INTRAVENOUS | Status: AC
  Administered 2014-12-05: 1000 mL via INTRAVENOUS

## 2014-12-05 MED ORDER — PROMETHAZINE HCL 25 MG/ML IJ SOLN
INTRAMUSCULAR | Status: AC
  Administered 2014-12-05: 12.5 mg via INTRAVENOUS
  Filled 2014-12-05: qty 1

## 2014-12-05 MED ORDER — PROMETHAZINE HCL 25 MG/ML IJ SOLN
12.5000 mg | Freq: Once | INTRAMUSCULAR | Status: AC
  Administered 2014-12-05: 12.5 mg via INTRAVENOUS

## 2014-12-05 MED ORDER — PROMETHAZINE HCL 25 MG PO TABS: 25.0000 mg | ORAL_TABLET | Freq: Three times a day (TID) | ORAL | Status: DC | PRN

## 2014-12-05 NOTE — ED Notes (Signed)
[redacted] wks pregnant, reports nausea and dizziness on and off

## 2014-12-05 NOTE — ED Provider Notes (Signed)
[facility] Emergency Department Provider Note  ____________________________________________  Time seen: Approximately 4:03 PM  I have reviewed the triage vital signs and the nursing notes.   HISTORY  Chief Complaint Nausea    HPI Abigail [name] is a 24 y.o. female who presents with intractable nausea and vomiting. She has been taking Diclegis and Vitamin B plus one other medication she is unable to recall the name. [facility] and has [facility] follow up appointment scheduled for Thursday. She has been unable to tolerate po since 2 days ago. She denies abdominal pain, cramping, vaginal discharge/bleeding.   Gravida 5, para 1,abortions 3. Past Medical History  Diagnosis Date  . Endometriosis   . Ovarian cyst     There are no active problems to display for this patient.   Past Surgical History  Procedure Laterality Date  . Iud removal      Current Outpatient Rx  Name  Route  Sig  Dispense  Refill  . ibuprofen (ADVIL,MOTRIN) 200 MG tablet   Oral   Take 200 mg by mouth every 6 (six) hours as needed for mild pain.         Marland Kitchen. oxyCODONE-acetaminophen (PERCOCET/ROXICET) 5-325 MG per tablet   Oral   Take 1-2 tablets by mouth every 4 (four) hours as needed for severe pain.   15 tablet   0   . promethazine (PHENERGAN) 25 MG tablet   Oral   Take 1 tablet (25 mg total) by mouth every 8 (eight) hours as needed for nausea or vomiting.   15 tablet   0     Allergies Penicillins  History reviewed. No pertinent family history.  Social History History  Substance Use Topics  . Smoking status: Current Every Day Smoker -- 0.50 packs/day    Types: Cigarettes  . Smokeless tobacco: Not on file  . Alcohol Use: Yes     Comment: occ    Review of Systems Constitutional: No fever/chills Eyes: No visual changes. ENT: No sore throat. Cardiovascular: Denies chest pain. Respiratory: Denies shortness of breath. Gastrointestinal: No abdominal  pain.  No nausea, no vomiting.  No diarrhea.  No constipation. Genitourinary: Negative for dysuria. Negative for vaginal discharge or bleeding. Musculoskeletal: Negative for back pain. Skin: Negative for rash. Neurological: Negative for headaches, focal weakness or numbness.  10-point ROS otherwise negative.  ____________________________________________   PHYSICAL EXAM:  VITAL SIGNS: ED Triage Vitals  Enc Vitals Group     BP 12/05/14 1515 116/66 mmHg     Pulse Rate 12/05/14 1515 78     Resp 12/05/14 1515 20     Temp 12/05/14 1515 98 F (36.7 C)     Temp Source 12/05/14 1515 Oral     SpO2 12/05/14 1515 100 %     Weight 12/05/14 1515 232 lb (105.235 kg)     Height 12/05/14 1515 5\' 5"  (1.651 m)     Head Cir --      Peak Flow --      Pain Score --      Pain Loc --      Pain Edu? --      Excl. in GC? --     Constitutional: Alert and oriented. Well appearing and in no acute distress. Eyes: Conjunctivae are normal. PERRL. EOMI. Head: Atraumatic. Nose: No congestion/rhinnorhea. Mouth/Throat: Mucous membranes are moist.  Oropharynx non-erythematous. Neck: No stridor.   Cardiovascular: Normal rate, regular rhythm. Grossly normal heart sounds.  Good peripheral circulation. Respiratory:  Normal respiratory effort.  No retractions. Lungs CTAB. Gastrointestinal: Soft and nontender. No distention. No abdominal bruits. No CVA tenderness. Musculoskeletal: No lower extremity tenderness nor edema.  No joint effusions. Neurologic:  Normal speech and language. No gross focal neurologic deficits are appreciated. Speech is normal. No gait instability. Skin:  Skin is warm, dry and intact. No rash noted. Psychiatric: Mood and affect are normal. Speech and behavior are normal.  ____________________________________________   LABS (all labs ordered are listed, but only abnormal results are displayed)  Labs Reviewed - No data to  display ____________________________________________  EKG   ____________________________________________  RADIOLOGY   ____________________________________________   PROCEDURES  Procedure(s) performed: None  Critical Care performed: No  ____________________________________________   INITIAL IMPRESSION / ASSESSMENT AND PLAN / ED COURSE  Pertinent labs & imaging results that were available during my care of the patient were reviewed by me and considered in my medical decision making (see chart for details).  Patient improved after 1 L of normal saline and 12.5 mg of Phenergan. She was advised to follow-up with her OB/GYN as scheduled. She was advised to return to the emergency department for symptoms that change or worsen if she is unable to schedule an earlier appointment with her OB/GYN. ____________________________________________   FINAL CLINICAL IMPRESSION(S) / ED DIAGNOSES  Final diagnoses:  Nausea/vomiting in pregnancy      Chinita PesterCari B Ranisha Allaire, FNP 12/05/14 [phone]  Phineas SemenGraydon Goodman, MD 12/05/14 [phone]

## 2014-12-05 NOTE — Discharge Instructions (Signed)
Hyperemesis Gravidarum °Hyperemesis gravidarum is a severe form of nausea and vomiting that happens during pregnancy. Hyperemesis is worse than morning sickness. It may cause you to have nausea or vomiting all day for many days. It may keep you from eating and drinking enough food and liquids. Hyperemesis usually occurs during the first half (the first 20 weeks) of pregnancy. It often goes away once a woman is in her second half of pregnancy. However, sometimes hyperemesis continues through an entire pregnancy.  °CAUSES  °The cause of this condition is not completely known but is thought to be related to changes in the body's hormones when pregnant. It could be from the high level of the pregnancy hormone or an increase in estrogen in the body.  °SIGNS AND SYMPTOMS  °· Severe nausea and vomiting. °· Nausea that does not go away. °· Vomiting that does not allow you to keep any food down. °· Weight loss and body fluid loss (dehydration). °· Having no desire to eat or not liking food you have previously enjoyed. °DIAGNOSIS  °Your health care provider will do a physical exam and ask you about your symptoms. He or she may also order blood tests and urine tests to make sure something else is not causing the problem.  °TREATMENT  °You may only need medicine to control the problem. If medicines do not control the nausea and vomiting, you will be treated in the hospital to prevent dehydration, increased acid in the blood (acidosis), weight loss, and changes in the electrolytes in your body that may harm the unborn baby (fetus). You may need IV fluids.  °HOME CARE INSTRUCTIONS  °· Only take over-the-counter or prescription medicines as directed by your health care provider. °· Try eating a couple of dry crackers or toast in the morning before getting out of bed. °· Avoid foods and smells that upset your stomach. °· Avoid fatty and spicy foods. °· Eat 5-6 small meals a day. °· Do not drink when eating meals. Drink between  meals. °· For snacks, eat high-protein foods, such as cheese. °· Eat or suck on things that have ginger in them. Ginger helps nausea. °· Avoid food preparation. The smell of food can spoil your appetite. °· Avoid iron pills and iron in your multivitamins until after 3-4 months of being pregnant. However, consult with your health care provider before stopping any prescribed iron pills. °SEEK MEDICAL CARE IF:  °· Your abdominal pain increases. °· You have a severe headache. °· You have vision problems. °· You are losing weight. °SEEK IMMEDIATE MEDICAL CARE IF:  °· You are unable to keep fluids down. °· You vomit blood. °· You have constant nausea and vomiting. °· You have excessive weakness. °· You have extreme thirst. °· You have dizziness or fainting. °· You have a fever or persistent symptoms for more than 2-3 days. °· You have a fever and your symptoms suddenly get worse. °MAKE SURE YOU:  °· Understand these instructions. °· Will watch your condition. °· Will get help right away if you are not doing well or get worse. °Document Released: 07/07/2005 Document Revised: 04/27/2013 Document Reviewed: 02/16/2013 °ExitCare® Patient Information ©2015 [employer]. This information is not intended to replace advice given to you by your health care provider. Make sure you discuss any questions you have with your health care provider. ° °

## 2014-12-13 ENCOUNTER — Encounter: Payer: Self-pay | Admitting: *Deleted

## 2014-12-13 ENCOUNTER — Emergency Department
Admission: EM | Admit: 2014-12-13 | Discharge: 2014-12-13 | Disposition: A | Discharge status: Home / Self Care | Payer: No Typology Code available for payment source | Attending: Emergency Medicine | Admitting: Emergency Medicine

## 2014-12-13 DIAGNOSIS — R112 Nausea with vomiting, unspecified: Secondary | ICD-10-CM

## 2014-12-13 DIAGNOSIS — Z3A09 9 weeks gestation of pregnancy: Secondary | ICD-10-CM | POA: Diagnosis not present

## 2014-12-13 DIAGNOSIS — Z3490 Encounter for supervision of normal pregnancy, unspecified, unspecified trimester: Secondary | ICD-10-CM

## 2014-12-13 DIAGNOSIS — O99331 Smoking (tobacco) complicating pregnancy, first trimester: Secondary | ICD-10-CM | POA: Insufficient documentation

## 2014-12-13 DIAGNOSIS — Z880 Allergy status to penicillin: Secondary | ICD-10-CM | POA: Insufficient documentation

## 2014-12-13 DIAGNOSIS — Z79899 Other long term (current) drug therapy: Secondary | ICD-10-CM | POA: Insufficient documentation

## 2014-12-13 DIAGNOSIS — O210 Mild hyperemesis gravidarum: Secondary | ICD-10-CM | POA: Insufficient documentation

## 2014-12-13 DIAGNOSIS — F17210 Nicotine dependence, cigarettes, uncomplicated: Secondary | ICD-10-CM | POA: Insufficient documentation

## 2014-12-13 MED ORDER — SODIUM CHLORIDE 0.9 % IV BOLUS (SEPSIS)
1000.0000 mL | Freq: Once | INTRAVENOUS | Status: AC
  Administered 2014-12-13: 1000 mL via INTRAVENOUS

## 2014-12-13 MED ORDER — PROMETHAZINE HCL 25 MG/ML IJ SOLN
25.0000 mg | Freq: Once | INTRAMUSCULAR | Status: AC
  Administered 2014-12-13: 25 mg via INTRAVENOUS
  Filled 2014-12-13: qty 1

## 2014-12-13 MED ORDER — PROMETHAZINE HCL 25 MG/ML IJ SOLN
INTRAMUSCULAR | Status: AC
  Administered 2014-12-13: 25 mg via INTRAVENOUS
  Filled 2014-12-13: qty 1

## 2014-12-13 MED ORDER — PROMETHAZINE HCL 25 MG RE SUPP: 25.0000 mg | Freq: Four times a day (QID) | RECTAL | Status: DC | PRN

## 2014-12-13 NOTE — Discharge Instructions (Signed)
Please seek medical attention for any high fevers, chest pain, shortness of breath, change in behavior, persistent vomiting, bloody stool or any other new or concerning symptoms.  First Trimester of Pregnancy The first trimester of pregnancy is from week 1 until the end of week 12 (months 1 through 3). A week after a sperm fertilizes an egg, the egg will implant on the wall of the uterus. This embryo will begin to develop into a baby. Genes from you and your partner are forming the baby. The female genes determine whether the baby is a boy or a girl. At 6-8 weeks, the eyes and face are formed, and the heartbeat can be seen on ultrasound. At the end of 12 weeks, all the baby's organs are formed.  Now that you are pregnant, you will want to do everything you can to have a healthy baby. Two of the most important things are to get good prenatal care and to follow your health care provider's instructions. Prenatal care is all the medical care you receive before the baby's birth. This care will help prevent, find, and treat any problems during the pregnancy and childbirth. BODY CHANGES Your body goes through many changes during pregnancy. The changes vary from woman to woman.   You may gain or lose a couple of pounds at first.  You may feel sick to your stomach (nauseous) and throw up (vomit). If the vomiting is uncontrollable, call your health care provider.  You may tire easily.  You may develop headaches that can be relieved by medicines approved by your health care provider.  You may urinate more often. Painful urination may mean you have a bladder infection.  You may develop heartburn as a result of your pregnancy.  You may develop constipation because certain hormones are causing the muscles that push waste through your intestines to slow down.  You may develop hemorrhoids or swollen, bulging veins (varicose veins).  Your breasts may begin to grow larger and become tender. Your nipples may  stick out more, and the tissue that surrounds them (areola) may become darker.  Your gums may bleed and may be sensitive to brushing and flossing.  Dark spots or blotches (chloasma, mask of pregnancy) may develop on your face. This will likely fade after the baby is born.  Your menstrual periods will stop.  You may have a loss of appetite.  You may develop cravings for certain kinds of food.  You may have changes in your emotions from day to day, such as being excited to be pregnant or being concerned that something may go wrong with the pregnancy and baby.  You may have more vivid and strange dreams.  You may have changes in your hair. These can include thickening of your hair, rapid growth, and changes in texture. Some women also have hair loss during or after pregnancy, or hair that feels dry or thin. Your hair will most likely return to normal after your baby is born. WHAT TO EXPECT AT YOUR PRENATAL VISITS During a routine prenatal visit:  You will be weighed to make sure you and the baby are growing normally.  Your blood pressure will be taken.  Your abdomen will be measured to track your baby's growth.  The fetal heartbeat will be listened to starting around week 10 or 12 of your pregnancy.  Test results from any previous visits will be discussed. Your health care provider may ask you:  How you are feeling.  If you are feeling the  baby move.  If you have had any abnormal symptoms, such as leaking fluid, bleeding, severe headaches, or abdominal cramping.  If you have any questions. Other tests that may be performed during your first trimester include:  Blood tests to find your blood type and to check for the presence of any previous infections. They will also be used to check for low iron levels (anemia) and Rh antibodies. Later in the pregnancy, blood tests for diabetes will be done along with other tests if problems develop.  Urine tests to check for infections,  diabetes, or protein in the urine.  An ultrasound to confirm the proper growth and development of the baby.  An amniocentesis to check for possible genetic problems.  Fetal screens for spina bifida and Down syndrome.  You may need other tests to make sure you and the baby are doing well. HOME CARE INSTRUCTIONS  Medicines  Follow your health care provider's instructions regarding medicine use. Specific medicines may be either safe or unsafe to take during pregnancy.  Take your prenatal vitamins as directed.  If you develop constipation, try taking a stool softener if your health care provider approves. Diet  Eat regular, well-balanced meals. Choose a variety of foods, such as meat or vegetable-based protein, fish, milk and low-fat dairy products, vegetables, fruits, and whole grain breads and cereals. Your health care provider will help you determine the amount of weight gain that is right for you.  Avoid raw meat and uncooked cheese. These carry germs that can cause birth defects in the baby.  Eating four or five small meals rather than three large meals a day may help relieve nausea and vomiting. If you start to feel nauseous, eating a few soda crackers can be helpful. Drinking liquids between meals instead of during meals also seems to help nausea and vomiting.  If you develop constipation, eat more high-fiber foods, such as fresh vegetables or fruit and whole grains. Drink enough fluids to keep your urine clear or pale yellow. Activity and Exercise  Exercise only as directed by your health care provider. Exercising will help you:  Control your weight.  Stay in shape.  Be prepared for labor and delivery.  Experiencing pain or cramping in the lower abdomen or low back is a good sign that you should stop exercising. Check with your health care provider before continuing normal exercises.  Try to avoid standing for long periods of time. Move your legs often if you must stand in  one place for a long time.  Avoid heavy lifting.  Wear low-heeled shoes, and practice good posture.  You may continue to have sex unless your health care provider directs you otherwise. Relief of Pain or Discomfort  Wear a good support bra for breast tenderness.   Take warm sitz baths to soothe any pain or discomfort caused by hemorrhoids. Use hemorrhoid cream if your health care provider approves.   Rest with your legs elevated if you have leg cramps or low back pain.  If you develop varicose veins in your legs, wear support hose. Elevate your feet for 15 minutes, 3-4 times a day. Limit salt in your diet. Prenatal Care  Schedule your prenatal visits by the twelfth week of pregnancy. They are usually scheduled monthly at first, then more often in the last 2 months before delivery.  Write down your questions. Take them to your prenatal visits.  Keep all your prenatal visits as directed by your health care provider. Safety  Wear your seat  belt at all times when driving.  Make a list of emergency phone numbers, including numbers for family, friends, the hospital, and police and fire departments. General Tips  Ask your health care provider for a referral to a local prenatal education class. Begin classes no later than at the beginning of month 6 of your pregnancy.  Ask for help if you have counseling or nutritional needs during pregnancy. Your health care provider can offer advice or refer you to specialists for help with various needs.  Do not use hot tubs, steam rooms, or saunas.  Do not douche or use tampons or scented sanitary pads.  Do not cross your legs for long periods of time.  Avoid cat litter boxes and soil used by cats. These carry germs that can cause birth defects in the baby and possibly loss of the fetus by miscarriage or stillbirth.  Avoid all smoking, herbs, alcohol, and medicines not prescribed by your health care provider. Chemicals in these affect the  formation and growth of the baby.  Schedule a dentist appointment. At home, brush your teeth with a soft toothbrush and be gentle when you floss. SEEK MEDICAL CARE IF:   You have dizziness.  You have mild pelvic cramps, pelvic pressure, or nagging pain in the abdominal area.  You have persistent nausea, vomiting, or diarrhea.  You have a bad smelling vaginal discharge.  You have pain with urination.  You notice increased swelling in your face, hands, legs, or ankles. SEEK IMMEDIATE MEDICAL CARE IF:   You have a fever.  You are leaking fluid from your vagina.  You have spotting or bleeding from your vagina.  You have severe abdominal cramping or pain.  You have rapid weight gain or loss.  You vomit blood or material that looks like coffee grounds.  You are exposed to [place] measles and have never had them.  You are exposed to fifth disease or chickenpox.  You develop a severe headache.  You have shortness of breath.  You have any kind of trauma, such as from a fall or a car accident. Document Released: 07/01/2001 Document Revised: 11/21/2013 Document Reviewed: 05/17/2013 [facility] Patient Information 2015 [place] [employer], [place]. This information is not intended to replace advice given to you by your health care provider. Make sure you discuss any questions you have with your health care provider.

## 2014-12-13 NOTE — ED Provider Notes (Signed)
[facility] Emergency Department Provider Note    ____________________________________________  Time seen: 1820  I have reviewed the triage vital signs and the nursing notes.    HISTORY  Chief Complaint Emesis During Pregnancy   History limited by: Not Limited   HPI Abigail [name] is a 24 y.o. female presents to the emergency department today because of continued emesis and vomiting. Patient was seen in the emergency department roughly 1 week ago for similar symptoms. States she was given a prescription for Phenergan which was helping until yesterday when she started throwing up the Phenergan pills. States she has had multiple episodes of emesis today. States she also feels weak and tired. Denies any fevers or chest pain.     Past Medical History  Diagnosis Date  . Endometriosis   . Ovarian cyst     There are no active problems to display for this patient.   Past Surgical History  Procedure Laterality Date  . Iud removal      Current Outpatient Rx  Name  Route  Sig  Dispense  Refill  . ibuprofen (ADVIL,MOTRIN) 200 MG tablet   Oral   Take 200 mg by mouth every 6 (six) hours as needed for mild pain.         Marland Kitchen. oxyCODONE-acetaminophen (PERCOCET/ROXICET) 5-325 MG per tablet   Oral   Take 1-2 tablets by mouth every 4 (four) hours as needed for severe pain.   15 tablet   0   . promethazine (PHENERGAN) 25 MG suppository   Rectal   Place 1 suppository (25 mg total) rectally every 6 (six) hours as needed for nausea.   12 suppository   1   . promethazine (PHENERGAN) 25 MG tablet   Oral   Take 1 tablet (25 mg total) by mouth every 8 (eight) hours as needed for nausea or vomiting.   15 tablet   0     Allergies Penicillins  History reviewed. No pertinent family history.  Social History History  Substance Use Topics  . Smoking status: Current Every Day Smoker -- 0.50 packs/day    Types: Cigarettes  . Smokeless tobacco: Not on file   . Alcohol Use: Yes     Comment: occ    Review of Systems  Constitutional: Negative for fever. Cardiovascular: Negative for chest pain. Respiratory: Negative for shortness of breath. Gastrointestinal: Positive for nausea and vomiting. Genitourinary: Negative for dysuria. Musculoskeletal: Negative for back pain. Skin: Negative for rash. Neurological: Negative for headaches, focal weakness or numbness.   10-point ROS otherwise negative.  ____________________________________________   PHYSICAL EXAM:  Constitutional: Alert and oriented. Well appearing and in no distress. Eyes: Conjunctivae are normal. PERRL. Normal extraocular movements. ENT   Head: Normocephalic and atraumatic.   Nose: No congestion/rhinnorhea.   Mouth/Throat: Mucous membranes are moist.   Neck: No stridor. Hematological/Lymphatic/Immunilogical: No cervical lymphadenopathy. Cardiovascular: Normal rate, regular rhythm.  No murmurs, rubs, or gallops. Respiratory: Normal respiratory effort without tachypnea nor retractions. Breath sounds are clear and equal bilaterally. No wheezes/rales/rhonchi. Gastrointestinal: Soft and nontender. No distention. No rebound. No guarding. There is no CVA tenderness. Genitourinary: Deferred Musculoskeletal: Normal range of motion in all extremities. No joint effusions.  No lower extremity tenderness nor edema. Neurologic:  Normal speech and language. No gross focal neurologic deficits are appreciated. Speech is normal.  Skin:  Skin is warm, dry and intact. No rash noted. Psychiatric: Mood and affect are normal. Speech and behavior are normal. Patient exhibits appropriate insight and judgment.  ____________________________________________    LABS (pertinent positives/negatives)  None  ____________________________________________   EKG  None  ____________________________________________     RADIOLOGY  None  ____________________________________________   PROCEDURES  Procedure(s) performed: None  Critical Care performed: No  ____________________________________________   INITIAL IMPRESSION / ASSESSMENT AND PLAN / ED COURSE  Pertinent labs & imaging results that were available during my care of the patient were reviewed by me and considered in my medical decision making (see chart for details).  Patient here roughly [redacted] weeks pregnant. Has had continued nausea and vomiting. No longer controlled by the oral Phenergan at home. On exam patient appears well. No vomiting witnessed. Will give patient fluids and IV Phenergan and reassess.   Patient states she felt better after IV Phenergan and fluids. Will discharge home with Phenergan suppository prescription. ____________________________________________   FINAL CLINICAL IMPRESSION(S) / ED DIAGNOSES  Final diagnoses:  Pregnancy  Nausea and vomiting, vomiting of unspecified type     Phineas Semen, MD 12/13/14 2239

## 2014-12-13 NOTE — ED Notes (Signed)
Pt verbalizes understanding of discharge instructions,

## 2014-12-13 NOTE — ED Notes (Signed)
Pt to ED with emesis. Pt is [redacted] weeks pregnant. Pt states was seen here last week for same issues, prescribed promethezine, states "it helped a lot but since yesterday it just hasnt helped and i cannot keep down foods" Pt is AAOx3, ambulatory, states emesis x 5 today. Vitals wnl, denies any abd pain.

## 2015-01-20 ENCOUNTER — Encounter: Payer: Self-pay | Admitting: Emergency Medicine

## 2015-01-20 ENCOUNTER — Emergency Department
Admission: EM | Admit: 2015-01-20 | Discharge: 2015-01-21 | Disposition: A | Discharge status: Home / Self Care | Payer: No Typology Code available for payment source | Attending: Emergency Medicine | Admitting: Emergency Medicine

## 2015-01-20 ENCOUNTER — Emergency Department: Payer: No Typology Code available for payment source

## 2015-01-20 DIAGNOSIS — Z79899 Other long term (current) drug therapy: Secondary | ICD-10-CM | POA: Diagnosis not present

## 2015-01-20 DIAGNOSIS — M5126 Other intervertebral disc displacement, lumbar region: Secondary | ICD-10-CM | POA: Insufficient documentation

## 2015-01-20 DIAGNOSIS — O2692 Pregnancy related conditions, unspecified, second trimester: Secondary | ICD-10-CM

## 2015-01-20 DIAGNOSIS — R339 Retention of urine, unspecified: Secondary | ICD-10-CM | POA: Diagnosis not present

## 2015-01-20 DIAGNOSIS — Z3A15 15 weeks gestation of pregnancy: Secondary | ICD-10-CM | POA: Diagnosis not present

## 2015-01-20 DIAGNOSIS — F17210 Nicotine dependence, cigarettes, uncomplicated: Secondary | ICD-10-CM | POA: Insufficient documentation

## 2015-01-20 DIAGNOSIS — O99332 Smoking (tobacco) complicating pregnancy, second trimester: Secondary | ICD-10-CM | POA: Insufficient documentation

## 2015-01-20 DIAGNOSIS — R200 Anesthesia of skin: Secondary | ICD-10-CM | POA: Insufficient documentation

## 2015-01-20 DIAGNOSIS — Z880 Allergy status to penicillin: Secondary | ICD-10-CM | POA: Insufficient documentation

## 2015-01-20 DIAGNOSIS — O9989 Other specified diseases and conditions complicating pregnancy, childbirth and the puerperium: Secondary | ICD-10-CM | POA: Insufficient documentation

## 2015-01-20 DIAGNOSIS — M5136 Other intervertebral disc degeneration, lumbar region: Secondary | ICD-10-CM

## 2015-01-20 LAB — BASIC METABOLIC PANEL
Anion gap: 8 (ref 5–15)
BUN: 9 mg/dL (ref 6–20)
CHLORIDE: 106 mmol/L (ref 101–111)
CO2: 23 mmol/L (ref 22–32)
CREATININE: 0.64 mg/dL (ref 0.44–1.00)
Calcium: 9 mg/dL (ref 8.9–10.3)
GFR calc Af Amer: >60 mL/min (ref >60)
GFR calc non Af Amer: >60 mL/min (ref >60)
Glucose, Bld: 112 mg/dL — ABNORMAL HIGH (ref 65–99)
Potassium: 3.3 mmol/L — ABNORMAL LOW (ref 3.5–5.1)
Sodium: 137 mmol/L (ref 135–145)

## 2015-01-20 LAB — CBC WITH DIFFERENTIAL/PLATELET
BASOS ABS: 0 10*3/uL (ref 0–0.1)
BASOS PCT: 0 %
EOS PCT: 1 %
Eosinophils Absolute: 0.1 10*3/uL (ref 0–0.7)
HCT: 36.3 % (ref 35.0–47.0)
Hemoglobin: 12 g/dL (ref 12.0–16.0)
LYMPHS ABS: 3 10*3/uL (ref 1.0–3.6)
LYMPHS PCT: 26 %
MCH: 28.9 pg (ref 26.0–34.0)
MCHC: 33.1 g/dL (ref 32.0–36.0)
MCV: 87.4 fL (ref 80.0–100.0)
MONOS PCT: 6 %
Monocytes Absolute: 0.7 10*3/uL (ref 0.2–0.9)
NEUTROS PCT: 67 %
Neutro Abs: 7.8 10*3/uL — ABNORMAL HIGH (ref 1.4–6.5)
PLATELETS: 356 10*3/uL (ref 150–440)
RBC: 4.15 MIL/uL (ref 3.80–5.20)
RDW: 13 % (ref 11.5–14.5)
WBC: 11.6 10*3/uL — AB (ref 3.6–11.0)

## 2015-01-20 LAB — URINALYSIS COMPLETE WITH MICROSCOPIC (ARMC ONLY)
Bacteria, UA: NONE SEEN
Bilirubin Urine: NEGATIVE
GLUCOSE, UA: NEGATIVE mg/dL
Hgb urine dipstick: NEGATIVE
KETONES UR: NEGATIVE mg/dL
LEUKOCYTES UA: NEGATIVE
Nitrite: NEGATIVE
Protein, ur: 30 mg/dL — AB
Specific Gravity, Urine: 1.033 — ABNORMAL HIGH (ref 1.005–1.030)
pH: 5 (ref 5.0–8.0)

## 2015-01-20 MED ORDER — ACETAMINOPHEN 10 MG/ML IV SOLN
1000.0000 mg | Freq: Four times a day (QID) | INTRAVENOUS | Status: DC
  Administered 2015-01-21: 1000 mg via INTRAVENOUS
  Filled 2015-01-20 (×3): qty 100

## 2015-01-20 NOTE — ED Notes (Signed)
Pt taken to MRI  

## 2015-01-20 NOTE — ED Provider Notes (Signed)
[facility] Emergency Department Provider Note  ____________________________________________  Time seen: Approximately 9:06 PM  I have reviewed the triage vital signs and the nursing notes.   HISTORY  Chief Complaint Urinary Retention    HPI Abigail [name] is a 24 y.o. female patient reports she's been having left-sided arm and leg and face numbness intermittently for some time. Patient has been having some dribbling of urine for the last few days. And today was unable to pass her urine. She reports she has even tried to get sitting in a bathtub full of warm water. She has not been able to pass her urine. Patient also reports having low back pain for some time. Patient reports today she has been unable to fill her left buttocks. Patient has a lot of pressure in her suprapubic area since she can't pass her urine.   Past Medical History  Diagnosis Date  . Endometriosis   . Ovarian cyst     There are no active problems to display for this patient.   Past Surgical History  Procedure Laterality Date  . Iud removal      Current Outpatient Rx  Name  Route  Sig  Dispense  Refill  . ondansetron (ZOFRAN) 4 MG tablet   Oral   Take 4 mg by mouth every 8 (eight) hours as needed for nausea or vomiting.         . Prenatal Multivit-Min-Fe-FA (PRENATAL VITAMINS PO)   Oral   Take by mouth.         . venlafaxine (EFFEXOR) 25 MG tablet   Oral   Take 25 mg by mouth as needed.         Marland Kitchen ibuprofen (ADVIL,MOTRIN) 200 MG tablet   Oral   Take 200 mg by mouth every 6 (six) hours as needed for mild pain.         Marland Kitchen oxyCODONE-acetaminophen (PERCOCET/ROXICET) 5-325 MG per tablet   Oral   Take 1-2 tablets by mouth every 4 (four) hours as needed for severe pain.   15 tablet   0   . promethazine (PHENERGAN) 25 MG suppository   Rectal   Place 1 suppository (25 mg total) rectally every 6 (six) hours as needed for nausea.   12 suppository   1   .  promethazine (PHENERGAN) 25 MG tablet   Oral   Take 1 tablet (25 mg total) by mouth every 8 (eight) hours as needed for nausea or vomiting.   15 tablet   0     Allergies Penicillins  History reviewed. No pertinent family history.  Social History History  Substance Use Topics  . Smoking status: Current Every Day Smoker -- 0.50 packs/day    Types: Cigarettes  . Smokeless tobacco: Not on file  . Alcohol Use: Yes     Comment: occ    Review of Systems Constitutional: No fever/chills Eyes: No visual changes. ENT: No sore throat. Cardiovascular: Denies chest pain. Respiratory: Denies shortness of breath. Gastrointestinal:   No nausea, no vomiting.  No diarrhea.  No constipation. Genitourinary: Negative for dysuria!! Musculoskeletal: History of back pain. Skin: Negative for rash.  10-point ROS otherwise negative.  ____________________________________________   PHYSICAL EXAM:  VITAL SIGNS: ED Triage Vitals  Enc Vitals Group     BP 01/20/15 1926 105/60 mmHg     Pulse Rate 01/20/15 1926 74     Resp 01/20/15 2042 18     Temp 01/20/15 1926 98.9 F (37.2 C)  Temp Source 01/20/15 1926 Oral     SpO2 01/20/15 1926 100 %     Weight 01/20/15 1926 215 lb (97.523 kg)     Height 01/20/15 1926 5\' 5"  (1.651 m)     Head Cir --      Peak Flow --      Pain Score 01/20/15 1937 8     Pain Loc --      Pain Edu? --      Excl. in GC? --     Constitutional: Alert and oriented. Well appearing and in no acute distress. Eyes: Conjunctivae are normal. PERRL. EOMI. Head: Atraumatic. Nose: No congestion/rhinnorhea. Mouth/Throat: Mucous membranes are moist.  Oropharynx non-erythematous. Neck: No stridor.  Cardiovascular: Normal rate, regular rhythm. Grossly normal heart sounds.  Good peripheral circulation. Respiratory: Normal respiratory effort.  No retractions. Lungs CTAB. Gastrointestinal: Soft and nontender suffer some mild tenderness suprapubically. No distention. No abdominal  bruits. No CVA tenderness. Musculoskeletal: No lower extremity tenderness nor edema.  No joint effusions. Patient does have some tenderness over the L-spine  Neurologic:  Normal speech and language. No gross focal neurologic deficits are appreciated except for the fact that the patient is unable to feel a Q-tip touching her left buttocks up from posteriorly up to approximately fourchette area and buttocks feels normal. Speech is normal. No gait instability. Normal introitus and will sensation anteriorly over the labia urine looks clear on catheterization Skin:  Skin is warm, dry and intact. No rash noted. Psychiatric: Mood and affect are normal. Speech and behavior are normal.  ____________________________________________   LABS (all labs ordered are listed, but only abnormal results are displayed)  Labs Reviewed  URINE CULTURE  URINALYSIS COMPLETEWITH MICROSCOPIC ([facility] ONLY)  BASIC METABOLIC PANEL  CBC WITH DIFFERENTIAL/PLATELET   ____________________________________________  EKG  ____________________________________________  RADIOLOGY  Pending ____________________________________________   PROCEDURES    ____________________________________________   INITIAL IMPRESSION / ASSESSMENT AND PLAN / ED COURSE  Pertinent labs & imaging results that were available during my care of the patient were reviewed by me and considered in my medical decision making (see chart for details).  Signed out to Dr. Alfredo BachSeong pending MRI report of the L-spine ____________________________________________   FINAL CLINICAL IMPRESSION(S) / ED DIAGNOSES  Final diagnoses:  Numbness      Arnaldo NatalPaul F Malinda, MD 01/20/15 2349

## 2015-01-20 NOTE — ED Notes (Signed)
Bladder scanner: 

## 2015-01-20 NOTE — ED Notes (Signed)
Dr. Malinda at bedside.  

## 2015-01-20 NOTE — ED Notes (Signed)
Pt reports difficulty urinating x 2 days but today no urination.  Pt reports feeling intense pressure.  Pt reports being [redacted] wks pregnant.  Fetal HR 160.  Pt NAD at this time

## 2015-01-20 NOTE — ED Notes (Signed)
Pt states she has not urinated at all today.

## 2015-01-20 NOTE — ED Notes (Signed)
Pt presents to ER alert and in NAD. Pt states she has been having urinary retention, that started out as frequency. Pt states she is [redacted] weeks pregnant.

## 2015-01-21 DIAGNOSIS — O9989 Other specified diseases and conditions complicating pregnancy, childbirth and the puerperium: Secondary | ICD-10-CM | POA: Diagnosis not present

## 2015-01-21 MED ORDER — NITROFURANTOIN MONOHYD MACRO 100 MG PO CAPS: 100.0000 mg | ORAL_CAPSULE | Freq: Two times a day (BID) | ORAL | Status: DC

## 2015-01-21 NOTE — Discharge Instructions (Signed)
1. Take antibiotics as prescribed (Macrobid 100 mg twice daily 7 days). 2. Wear bladder catheter until seen by your OB doctor. 3. Return to the ER for worsening symptoms, fever, persistent vomiting or other concerns.  Acute Urinary Retention Acute urinary retention is the temporary inability to urinate. This is an uncommon problem in women. It can be caused by:  Infection.  A side effect of a medicine.  A problem in a nearby organ that presses or squeezes on the bladder or the urethra (the tube that drains the bladder).  Psychological problems.   Surgery on your bladder, urethra, or pelvic organs that causes obstruction to the outflow of urine from your bladder. HOME CARE INSTRUCTIONS  If you are sent home with a Foley catheter and a drainage system, you will need to discuss the best course of action with your health care provider. While the catheter is in, maintain a good intake of fluids. Keep the drainage bag emptied and lower than your catheter. This is so that contaminated urine will not flow back into your bladder, which could lead to a urinary tract infection. There are two main types of drainage bags. One is a large bag that usually is used at night. It has a good capacity that will allow you to sleep through the night without having to empty it. The second type is called a leg bag. It has a smaller capacity so it needs to be emptied more frequently. However, the main advantage is that it can be attached by a leg strap and goes underneath your clothing, allowing you the freedom to move about or leave your home. Only take over-the-counter or prescription medicines for pain, discomfort, or fever as directed by your health care provider.  SEEK MEDICAL CARE IF:  You develop a low-grade fever.  You experience spasms or leakage of urine with the spasms. SEEK IMMEDIATE MEDICAL CARE IF:   You develop chills or fever.  Your catheter stops draining urine.  Your catheter falls  out.  You start to develop increased bleeding that does not respond to rest and increased fluid intake. MAKE SURE YOU:  Understand these instructions.  Will watch your condition.  Will get help right away if you are not doing well or get worse. Document Released: 07/06/2006 Document Revised: 04/27/2013 Document Reviewed: 12/16/2012 [facility] Patient Information 2015 [place]. This information is not intended to replace advice given to you by your health care provider. Make sure you discuss any questions you have with your health care provider.  Paresthesia Paresthesia is an abnormal burning or prickling sensation. This sensation is generally felt in the hands, arms, legs, or feet. However, it may occur in any part of the body. It is usually not painful. The feeling may be described as:  Tingling or numbness.  "Pins and needles."  Skin crawling.  Buzzing.  Limbs "falling asleep."  Itching. Most people experience temporary (transient) paresthesia at some time in their lives. CAUSES  Paresthesia may occur when you breathe too quickly (hyperventilation). It can also occur without any apparent cause. Commonly, paresthesia occurs when pressure is placed on a nerve. The feeling quickly goes away once the pressure is removed. For some people, however, paresthesia is a long-lasting (chronic) condition caused by an underlying disorder. The underlying disorder may be:  A traumatic, direct injury to nerves. Examples include a:  Broken (fractured) neck.  Fractured skull.  A disorder affecting the brain and spinal cord (central nervous system). Examples include:  Transverse myelitis.  Encephalitis.  Transient ischemic attack.  Multiple sclerosis.  Stroke.  Tumor or blood vessel problems, such as an arteriovenous malformation pressing against the brain or spinal cord.  A condition that damages the peripheral nerves (peripheral neuropathy). Peripheral nerves are not part of  the brain and spinal cord. These conditions include:  Diabetes.  Peripheral vascular disease.  Nerve entrapment syndromes, such as carpal tunnel syndrome.  Shingles.  Hypothyroidism.  Vitamin B12 deficiencies.  Alcoholism.  Heavy metal poisoning (lead, arsenic).  Rheumatoid arthritis.  Systemic lupus erythematosus. DIAGNOSIS  Your caregiver will attempt to find the underlying cause of your paresthesia. Your caregiver may:  Take your medical history.  Perform a physical exam.  Order various lab tests.  Order imaging tests. TREATMENT  Treatment for paresthesia depends on the underlying cause. HOME CARE INSTRUCTIONS  Avoid drinking alcohol.  You may consider massage or acupuncture to help relieve your symptoms.  Keep all follow-up appointments as directed by your caregiver. SEEK IMMEDIATE MEDICAL CARE IF:   You feel weak.  You have trouble walking or moving.  You have problems with speech or vision.  You feel confused.  You cannot control your bladder or bowel movements.  You feel numbness after an injury.  You faint.  Your burning or prickling feeling gets worse when walking.  You have pain, cramps, or dizziness.  You develop a rash. MAKE SURE YOU:  Understand these instructions.  Will watch your condition.  Will get help right away if you are not doing well or get worse. Document Released: 06/27/2002 Document Revised: 09/29/2011 Document Reviewed: 03/28/2011 [facility] Patient Information 2015 [place], [place]. This information is not intended to replace advice given to you by your health care provider. Make sure you discuss any questions you have with your health care provider.  Herniated Disk A herniated disk occurs when a disk in your spine bulges out too far. Your spine (backbone) is made up of bones called vertebrae. A disk with a spongy center is located between each pair of bones. These disks act as shock absorbers when you move. A herniated  disk can cause pain and muscle weakness.  HOME CARE  Take all medicines as told by your doctor.  Rest for 2 days and then start moving.  Do not sit or stand for long periods of time.  Maintain good posture when sitting and standing.  Avoid moving in a way that causes pain, such as bending or lifting.  When you are able to start lifting things again:  [place] with your knees.  Keep your back straight.  Hold heavy objects close to your body.  If you are overweight, ask your doctor about starting a weight-loss program.  When you are able to start exercising, ask your doctor how much and what type of exercise is best for you.  Work with a physical therapist on stretching and strengthening exercises for your back.  Do not wear high-heeled shoes.  Do not sleep on your belly.  Do not smoke.  Keep all follow-up visits as told by your doctor. GET HELP IF:  You have back or neck pain that is not getting better after 4 weeks.  You have very bad pain in your back or neck.  You have a loss of feeling (numbness), tingling, or weakness along with pain. GET HELP RIGHT AWAY IF:  You have tingling, weakness, or loss of feeling that makes you unable to use your arms or legs.  You are not able to control when you  pee (urinate) or poop (bowel movement).  You have dizziness or fainting.  You have shortness of breath. MAKE SURE YOU:  Understand these instructions.  Will watch your condition.  Will get help right away if you are not doing well or get worse. Document Released: 11/21/2013 Document Reviewed: 06/10/2013 [facility] Patient Information 2015 [place], [place]. This information is not intended to replace advice given to you by your health care provider. Make sure you discuss any questions you have with your health care provider.

## 2015-01-21 NOTE — ED Provider Notes (Signed)
-----------------------------------------   1:13 AM on 01/21/2015 -----------------------------------------  MRI brain without contrast interpreted per Dr. Phill MyronMcClintock: Normal MRI of the brain.  MRI lumbar spine without contrast interpreted per Dr. Phill MyronMcClintock: 1. Small central disc protrusion at L4-5 with associated annular fissure. There is mild to moderate canal stenosis at this level related to the protruding disc, short pedicles, and ligamentous hypertrophy. 2. Minimal degenerative disc bulge at L5-S1 without significant stenosis. 3. Otherwise normal MRI of the lumbar spine. No other significant canal or foraminal narrowing identified. 4. Enlarged gravid uterus.  Updated patient of MRI results. Patient is resting in no acute distress. States she already has an appointment with neurology scheduled to evaluate her symptoms of left-sided numbness. Given her urinary retention, will discharge patient home with Foley catheter, place on empiric antibiotics, and follow-up with her OB/GYN next week. Strict return precautions given. Patient verbalizes understanding and agrees with plan of care.   Irean HongJade J Sung, MD 01/21/15 [phone]

## 2015-01-21 NOTE — ED Notes (Signed)
Pt instructed on catheter care and how to change from large urine bag to leg bag.  Pt verbalized understanding and performed teach-back.

## 2015-01-22 LAB — URINE CULTURE
Culture: NO GROWTH
SPECIAL REQUESTS: NORMAL

## 2015-02-20 ENCOUNTER — Emergency Department
Admission: EM | Admit: 2015-02-20 | Discharge: 2015-02-20 | Discharge status: Against Medical Advice | Payer: No Typology Code available for payment source | Attending: Emergency Medicine | Admitting: Emergency Medicine

## 2015-02-20 ENCOUNTER — Encounter: Payer: Self-pay | Admitting: *Deleted

## 2015-02-20 ENCOUNTER — Emergency Department: Payer: No Typology Code available for payment source

## 2015-02-20 DIAGNOSIS — Z3A19 19 weeks gestation of pregnancy: Secondary | ICD-10-CM | POA: Insufficient documentation

## 2015-02-20 DIAGNOSIS — O9989 Other specified diseases and conditions complicating pregnancy, childbirth and the puerperium: Secondary | ICD-10-CM | POA: Diagnosis not present

## 2015-02-20 DIAGNOSIS — R1030 Lower abdominal pain, unspecified: Secondary | ICD-10-CM | POA: Diagnosis not present

## 2015-02-20 LAB — COMPREHENSIVE METABOLIC PANEL
ALT: 32 U/L (ref 14–54)
AST: 27 U/L (ref 15–41)
Albumin: 2.9 g/dL — ABNORMAL LOW (ref 3.5–5.0)
Alkaline Phosphatase: 86 U/L (ref 38–126)
Anion gap: 9 (ref 5–15)
BILIRUBIN TOTAL: 0.2 mg/dL — AB (ref 0.3–1.2)
BUN: 8 mg/dL (ref 6–20)
CO2: 20 mmol/L — ABNORMAL LOW (ref 22–32)
Calcium: 8.6 mg/dL — ABNORMAL LOW (ref 8.9–10.3)
Chloride: 103 mmol/L (ref 101–111)
Creatinine, Ser: 0.57 mg/dL (ref 0.44–1.00)
GFR calc non Af Amer: >60 mL/min (ref >60)
Glucose, Bld: 117 mg/dL — ABNORMAL HIGH (ref 65–99)
Potassium: 3.5 mmol/L (ref 3.5–5.1)
Sodium: 132 mmol/L — ABNORMAL LOW (ref 135–145)
Total Protein: 6.6 g/dL (ref 6.5–8.1)

## 2015-02-20 LAB — URINALYSIS COMPLETE WITH MICROSCOPIC (ARMC ONLY)
Bilirubin Urine: NEGATIVE
Glucose, UA: NEGATIVE mg/dL
Hgb urine dipstick: NEGATIVE
Ketones, ur: NEGATIVE mg/dL
NITRITE: NEGATIVE
PROTEIN: 30 mg/dL — AB
RBC / HPF: NONE SEEN RBC/hpf (ref 0–5)
SPECIFIC GRAVITY, URINE: 1.03 (ref 1.005–1.030)
pH: 7 (ref 5.0–8.0)

## 2015-02-20 LAB — PREGNANCY, URINE: PREG TEST UR: POSITIVE — AB

## 2015-02-20 LAB — CBC
HCT: 33 % — ABNORMAL LOW (ref 35.0–47.0)
Hemoglobin: 11 g/dL — ABNORMAL LOW (ref 12.0–16.0)
MCH: 29.1 pg (ref 26.0–34.0)
MCHC: 33.3 g/dL (ref 32.0–36.0)
MCV: 87.1 fL (ref 80.0–100.0)
Platelets: 361 10*3/uL (ref 150–440)
RBC: 3.79 MIL/uL — ABNORMAL LOW (ref 3.80–5.20)
RDW: 14 % (ref 11.5–14.5)
WBC: 12.2 10*3/uL — AB (ref 3.6–11.0)

## 2015-02-20 NOTE — ED Notes (Signed)
Lab called about delay in HCG quant level, per staff will run at this time

## 2015-02-20 NOTE — ED Notes (Signed)
Pt states that she feels like she is having lower abdominal cramping and a "vibrating" sensation in the upper part of her abdomen. Pt is 18 weeks, 5 days. Pt denies n/v/d or vaginal bleeding, describes yellow vaginal discharge. Pt called Dr. Rinaldo Ratel office PTA and told her to come in for eval.

## 2015-02-20 NOTE — ED Notes (Signed)
Pt here with c/o abdominal cramping, reports 19w ([redacted]w[redacted]d by EDD), was told to come in by Dr Bonney Aid.

## 2015-02-20 NOTE — ED Notes (Signed)
Patient transported to Ultrasound 

## 2015-02-21 ENCOUNTER — Ambulatory Visit: Payer: Medicaid Other | Attending: Obstetrics and Gynecology | Admitting: Physical Therapy

## 2015-02-21 ENCOUNTER — Encounter: Payer: Self-pay | Admitting: Physical Therapy

## 2015-02-21 DIAGNOSIS — R531 Weakness: Secondary | ICD-10-CM

## 2015-02-21 DIAGNOSIS — M6289 Other specified disorders of muscle: Secondary | ICD-10-CM | POA: Insufficient documentation

## 2015-02-21 DIAGNOSIS — R269 Unspecified abnormalities of gait and mobility: Secondary | ICD-10-CM | POA: Diagnosis not present

## 2015-02-21 LAB — HCG, QUANTITATIVE, PREGNANCY: hCG, Beta Chain, Quant, S: 17084 m[IU]/mL — ABNORMAL HIGH (ref <5)

## 2015-02-22 NOTE — Patient Instructions (Signed)
Donning of SIJ belt  Exhalation upon exertion, principles and anatomy of deep core system  Equal weight bearing of BLE

## 2015-02-22 NOTE — Therapy (Signed)
[facility] MAIN [facility] SERVICES [address] [place], [place], [address] Phone: [phone]   Fax:  [phone]  Physical Therapy Evaluation  Patient Details  Name: DARREN NODAL MRN: [id number] Date of Birth: 1990-12-15 Referring Provider:  Vena Austria, MD  Encounter Date: 02/21/2015      PT End of Session - 02/22/15 0840    Visit Number 1   Number of Visits 1   Authorization Type Pt stated she is switching to Medicaid, which covers only for eval. Pt explained about pro bono services at [facility].    PT Start Time 1015   PT Stop Time 1115   PT Time Calculation (min) 60 min   Equipment Utilized During Treatment --  SIJ belt   Activity Tolerance Patient limited by pain  Pt reported belt provided her relief, demo'd less antalgic gait        Past Medical History  Diagnosis Date  . Endometriosis   . Ovarian cyst     Past Surgical History  Procedure Laterality Date  . Iud removal    . Laproscopy w/ attempt to remove l cyst but cyst was no longer present Left 2015    There were no vitals filed for this visit.  Visit Diagnosis:  Abnormality of gait  Weakness of left side of body      Subjective Assessment - 02/21/15 1027    Subjective Pt reported low back/hip pain L > R 8 years ago. Two years ago, she started experiencing L neck/shoulder pain w/ numbness along the L side of the body. Numbness dissipates when she changes positions. She can only tolerate staying in one position 30 mins. Two months ago, pt started noticing numbness along  lateral R LE to digit 4-5 when laying down in any position. Pain eases when she gets up. Pt feels like she is "handicapped alot" bc she is only restricted to walking around block in her dtr (almost 24  yr old)  but not able to go on trips. Pt can't clean (forward bending, folding clothes) and she picks things up from the floor with her feet.  Pt's MRI was performed in July and she was  informed that she had a herniated disc L4-5.      Pertinent History one vaginal delivery w/ epsiotomies, daily bowel movements with straining sometimes. Pt was initially seen by Dr. Bonney Aid for pain intercourse, endometriosis Sx in 2015 which lead to laproscopy to remove L cyst on ovary but at surgery, no cyst was present.  Pain with intercourse continue persist.    Limitations Sitting;House hold activities   Patient Stated Goals pain relief to 3/10, be able to hold her new born    Currently in Pain? Yes   Pain Score 8    Pain Location Shoulder   Pain Orientation Left   Pain Descriptors / Indicators Aching;Numbness   Pain Type Chronic pain   Pain Onset More than a month ago   Pain Frequency Constant   Effect of Pain on Daily Activities , grocery shopping,    Multiple Pain Sites Yes   Pain Score 8   Pain Location Back  SIJ  and Low back    Pain Descriptors / Indicators Numbness   Pain Frequency Constant   Aggravating Factors  when she stops moving, she "gets stiff"             [facility] PT Assessment - 02/22/15 0821    Assessment   Medical Diagnosis  lumbago w/ pregnancy   Precautions   Precautions None   Restrictions   Weight Bearing Restrictions No   Home Environment   Living Environment Private residence   Home Access Stairs to enter  5   Prior Function   Level of Independence Independent;Independent with basic ADLs  get out of bed, up steps   Sensation   Light Touch --  S1 R foot < L, L2-4 on LLE < RLE, LUE C3-5 < RUE   Sit to Stand   Comments 31.70 sec   UE on arm rests, poor eccentric control   Posture/Postural Control   Posture Comments anterior lean, decreased weighbearing on L foot ,   AROM   Overall AROM  --  50% less on L > R hip flexion seated   Overall AROM Comments --  overhead reaching LUE  limited a ear level   PROM   Overall PROM  --  overhead reaching LUE  limited a ear level   Strength   Overall Strength --  2/5 DF on L , 5/5 R, hip IR/ER/ knee  ext 3/5 L, 5/5 R,    Overall Strength Comments --  shoulder elevation midrange ROM 2/5, LUE 3/5, RUE 5/5   Palpation   SI assessment  relief w/ manually applied force closure   Palpation comment guarded, reported mm spasms w/ light palpation to L upper trap/midback mm in sidelying   Special Tests    Special Tests --  DTR: unable to elicit Lside, R L4, S1: 2+   Bed Mobility   Bed Mobility --  slow, limited to < 5' in each position due to onset of numbn   Ambulation/Gait   Gait Comments antalgic gait  anterior lean, decreased weighbearing on L foot                   [facility] Adult PT Treatment/Exercise - 02/22/15 0821    Self-Care   Other Self-Care Comments  attempted to offer relaxation technique: breathing, body scan   but pt could not tolerate > 3 min 2/2 onset of numbness   Neuro Re-ed    Neuro Re-ed Details  diaphragmatic breathing, exhale w/ sit-to stand, donning of SIJ belt, equal weight bearing BLE,   less antalgic gait post-Tx   Manual Therapy   Manual therapy comments attempted to apply manual Tx but pt could not tolerate even light touch                PT Education - 02/22/15 0840    Education provided Yes   Education Details SIJ belt , HEP   Person(s) Educated Patient   Methods Explanation;Demonstration;Tactile cues;Verbal cues   Comprehension Verbalized understanding;Returned demonstration          PT Short Term Goals - 02/22/15 0901    PT SHORT TERM GOAL #1   Title Pt will demo IND donning SIJ belt in order to walk w/ less pain.   Time 1   Period Days   Status Achieved   PT SHORT TERM GOAL #2   Title Pt will demo sit-to-stand technique w/ proper body mechanics and exhalation 3 reps in order to perform ADLs.    Time 1   Period Weeks   Status Achieved                  Plan - 02/22/15 0842    Clinical Impression Statement Pt is a 24 yo female whose S & Sx include abnormal myotomes, dermatomes, and deep  tendon reflexes (deficits  on LLE, UE most significant), antalgic gait and transfers, and guarded movements, and limited ROM which impact her ability to perform ADLs. Pt benefited from neuro-muscular re-edu with sit-to-stand t/f and  SIJ belt. She reported some relief to her pain and she demo'd less antalgic gait. Pt will not be scheduled for future visits because she stated she is switching to [facility] which only covers evaluation.Pt was educated about [facility] re: their pro bono services.    Pt will benefit from skilled therapeutic intervention in order to improve on the following deficits Abnormal gait;Decreased activity tolerance;Decreased balance;Difficulty walking;Decreased coordination;Decreased safety awareness;Postural dysfunction;Improper body mechanics;Impaired perceived functional ability;Increased muscle spasms;Pain;Impaired sensation;Decreased endurance;Decreased strength;Decreased mobility;Decreased range of motion;Obesity   Rehab Potential Fair   Clinical Impairments Affecting Rehab Potential chornicity of pain, pregnancy   PT Frequency 1x / week   PT Duration Other (comment)  one week    PT Treatment/Interventions ADLs/Self Care Home Management;Patient/family education;Energy conservation;Passive range of motion;Neuromuscular re-education;Gait training;Stair training;Functional mobility training;Therapeutic activities;Therapeutic exercise;Manual techniques;Moist Heat   Consulted and Agree with Plan of Care Patient         Problem List There are no active problems to display for this patient.   Mariane Masters ,PT, DPT, E-RYT  02/22/2015, 9:11 AM  [place] [facility] MAIN [facility] SERVICES [address] [place], [place], [address] Phone: [phone]   Fax:  [phone]

## 2015-02-28 ENCOUNTER — Ambulatory Visit: Payer: Medicaid Other | Admitting: Physical Therapy

## 2015-03-07 ENCOUNTER — Encounter: Payer: No Typology Code available for payment source | Admitting: Physical Therapy

## 2015-04-12 ENCOUNTER — Observation Stay
Admission: EM | Admit: 2015-04-12 | Discharge: 2015-04-12 | Disposition: A | Discharge status: Home / Self Care | Payer: Medicaid Other | Attending: Obstetrics & Gynecology | Admitting: Obstetrics & Gynecology

## 2015-04-12 ENCOUNTER — Encounter: Payer: Self-pay | Admitting: *Deleted

## 2015-04-12 DIAGNOSIS — O26892 Other specified pregnancy related conditions, second trimester: Secondary | ICD-10-CM | POA: Diagnosis not present

## 2015-04-12 DIAGNOSIS — O26899 Other specified pregnancy related conditions, unspecified trimester: Secondary | ICD-10-CM

## 2015-04-12 DIAGNOSIS — R102 Pelvic and perineal pain: Secondary | ICD-10-CM | POA: Diagnosis not present

## 2015-04-12 DIAGNOSIS — R42 Dizziness and giddiness: Secondary | ICD-10-CM | POA: Diagnosis not present

## 2015-04-12 DIAGNOSIS — E860 Dehydration: Secondary | ICD-10-CM | POA: Diagnosis not present

## 2015-04-12 DIAGNOSIS — R109 Unspecified abdominal pain: Secondary | ICD-10-CM | POA: Insufficient documentation

## 2015-04-12 DIAGNOSIS — Z3A26 26 weeks gestation of pregnancy: Secondary | ICD-10-CM | POA: Insufficient documentation

## 2015-04-12 MED ORDER — ACETAMINOPHEN 500 MG PO TABS
ORAL_TABLET | ORAL | Status: AC
  Administered 2015-04-12: 1000 mg
  Filled 2015-04-12: qty 2

## 2015-04-12 MED ORDER — ACETAMINOPHEN 500 MG PO TABS: 1000.0000 mg | ORAL_TABLET | Freq: Four times a day (QID) | ORAL | Status: DC | PRN

## 2015-04-12 NOTE — Discharge Summary (Signed)
See note, pt not in labor. Dx- abdominal pain, round ligament pain, dizziness, dehydration, second trimester. F/U in office.

## 2015-04-12 NOTE — OB Triage Note (Signed)
Recvd from ED per wheelchair with c/o abd pain.  Changed to gown.  EFM applied.  Oriented to room and paln of care discussed.  Verbalized understanding.

## 2015-04-12 NOTE — H&P (Signed)
Obstetrics Admission History & Physical   Abdominal Pain   HPI:  24 y.o. G1P0 @ Unknown (Not found.). Admitted on 04/12/2015:   Presents for abd pain, suprapubic and rad to back, 4 days, mod at times, occas assoc w dizziness, has been dehydrated at times.   Prenatal care at: at [facility]  PMHx:  Past Medical History  Diagnosis Date  . Endometriosis   . Ovarian cyst    PSHx:  Past Surgical History  Procedure Laterality Date  . Iud removal    . Laproscopy w/ attempt to remove l cyst but cyst was no longer present Left 2015   Medications:  Prescriptions prior to admission  Medication Sig Dispense Refill Last Dose  . ibuprofen (ADVIL,MOTRIN) 200 MG tablet Take 200 mg by mouth every 6 (six) hours as needed for mild pain.   Not Taking  . nitrofurantoin, macrocrystal-monohydrate, (MACROBID) 100 MG capsule Take 1 capsule (100 mg total) by mouth 2 (two) times daily. (Patient not taking: Reported on 02/21/2015) 14 capsule 0 Not Taking  . ondansetron (ZOFRAN) 4 MG tablet Take 4 mg by mouth every 8 (eight) hours as needed for nausea or vomiting.   Not Taking  . oxyCODONE-acetaminophen (PERCOCET/ROXICET) 5-325 MG per tablet Take 1-2 tablets by mouth every 4 (four) hours as needed for severe pain. (Patient not taking: Reported on 02/21/2015) 15 tablet 0 Not Taking  . Prenatal Multivit-Min-Fe-FA (PRENATAL VITAMINS PO) Take by mouth.   Taking  . promethazine (PHENERGAN) 25 MG suppository Place 1 suppository (25 mg total) rectally every 6 (six) hours as needed for nausea. (Patient not taking: Reported on 02/21/2015) 12 suppository 1 Not Taking  . promethazine (PHENERGAN) 25 MG tablet Take 1 tablet (25 mg total) by mouth every 8 (eight) hours as needed for nausea or vomiting. (Patient not taking: Reported on 02/21/2015) 15 tablet 0 Not Taking  . venlafaxine (EFFEXOR) 25 MG tablet Take 25 mg by mouth as needed.   Not Taking   Allergies: is allergic to penicillins. OBHx:  OB History  Gravida Para Term  Preterm AB SAB TAB Ectopic Multiple Living  1             # Outcome Date GA Lbr Len/2nd Weight Sex Delivery Anes PTL Lv  1 Current              WUJ:[id number] except as detailed in HPI. Soc Hx: Pregnancy welcomed  Objective:   Filed Vitals:   04/12/15 1518  BP: 111/56  Pulse: 78  Temp: 98.2 F (36.8 C)  Resp: 16   General: Well nourished, well developed female in no acute distress.  Skin: Warm and dry.  Cardiovascular:Regular rate and rhythm. Respiratory: Clear to auscultation bilateral. Normal respiratory effort Abdomen: gravid, FHR 140s, NT, ND Neuro/Psych: Normal mood and affect.   Pelvic exam: is not limited by body habitus EGBUS: within normal limits Vagina: within normal limits and with normal mucosa blood in the vault Cervix: CL/TH/HI Uterus: Uterus demonstrates irritability pattern.  Adnexa: not evaluated  Assessment & Plan:   24 y.o. G1P0  weeks, Admitted on 04/12/2015: abd pain, dehydration, no s/sx PTL.     Discharge Home

## 2015-05-28 ENCOUNTER — Observation Stay
Admission: EM | Admit: 2015-05-28 | Discharge: 2015-05-28 | Disposition: A | Discharge status: Home / Self Care | Payer: Medicaid Other | Attending: Obstetrics and Gynecology | Admitting: Obstetrics and Gynecology

## 2015-05-28 ENCOUNTER — Encounter: Payer: Self-pay | Admitting: *Deleted

## 2015-05-28 DIAGNOSIS — R102 Pelvic and perineal pain unspecified side: Secondary | ICD-10-CM

## 2015-05-28 DIAGNOSIS — Z3A32 32 weeks gestation of pregnancy: Secondary | ICD-10-CM | POA: Insufficient documentation

## 2015-05-28 DIAGNOSIS — O26893 Other specified pregnancy related conditions, third trimester: Secondary | ICD-10-CM | POA: Diagnosis not present

## 2015-05-28 DIAGNOSIS — O26899 Other specified pregnancy related conditions, unspecified trimester: Secondary | ICD-10-CM

## 2015-05-28 MED ORDER — DOCUSATE SODIUM 100 MG PO CAPS: 100.0000 mg | ORAL_CAPSULE | Freq: Every day | ORAL | Status: DC

## 2015-05-28 NOTE — H&P (Signed)
Obstetric H&P   Chief Complaint: Pelvic pain  Prenatal Care Provider: [facility]  History of Present Illness: 24 y.o. [id number] [redacted]w[redacted]d by 07/19/2015, by Last Menstrual Period presenting to L&D with history of chronic pelvic pain presenting for worsening pelvic pain contractions.  Was seen by me within the last week as well as Doctor Tiburcio Pea with negative FFN and close cervix.  +FM, no LOF, no VB   Review of Systems: 10 point review of systems negative unless otherwise noted in HPI  Past Medical History: Past Medical History  Diagnosis Date  . Endometriosis   . Ovarian cyst     Past Surgical History: Past Surgical History  Procedure Laterality Date  . Iud removal    . Laproscopy w/ attempt to remove l cyst but cyst was no longer present Left 2015   Family History: Family History  Problem Relation Age of Onset  . Hypertension Mother   . Diabetes Mother   . Cancer Mother     Social History: Social History   Social History  . Marital Status: Single    Spouse Name: N/A  . Number of Children: N/A  . Years of Education: N/A   Occupational History  . Not on file.   Social History Main Topics  . Smoking status: Former Smoker -- 0.50 packs/day    Types: Cigarettes    Quit date: 02/18/2015  . Smokeless tobacco: Not on file  . Alcohol Use: No     Comment: occ  . Drug Use: No  . Sexual Activity: Yes    Birth Control/ Protection: None   Other Topics Concern  . Not on file   Social History Narrative    Medications: Prior to Admission medications   Medication Sig Start Date End Date Taking? Authorizing Provider  Prenatal Multivit-Min-Fe-FA (PRENATAL VITAMINS PO) Take by mouth.   Yes Historical Provider, MD  ibuprofen (ADVIL,MOTRIN) 200 MG tablet Take 200 mg by mouth every 6 (six) hours as needed for mild pain.    Historical Provider, MD  nitrofurantoin, macrocrystal-monohydrate, (MACROBID) 100 MG capsule Take 1 capsule (100 mg total) by mouth 2 (two) times daily. Patient not  taking: Reported on 02/21/2015 01/21/15   Irean Hong, MD  ondansetron (ZOFRAN) 4 MG tablet Take 4 mg by mouth every 8 (eight) hours as needed for nausea or vomiting.    Historical Provider, MD  oxyCODONE-acetaminophen (PERCOCET/ROXICET) 5-325 MG per tablet Take 1-2 tablets by mouth every 4 (four) hours as needed for severe pain. Patient not taking: Reported on 02/21/2015 12/25/13   Elpidio Anis, PA-C  promethazine (PHENERGAN) 25 MG suppository Place 1 suppository (25 mg total) rectally every 6 (six) hours as needed for nausea. Patient not taking: Reported on 02/21/2015 12/13/14 12/13/15  Phineas Semen, MD  promethazine (PHENERGAN) 25 MG tablet Take 1 tablet (25 mg total) by mouth every 8 (eight) hours as needed for nausea or vomiting. Patient not taking: Reported on 02/21/2015 12/05/14   Chinita Pester, FNP  venlafaxine (EFFEXOR) 25 MG tablet Take 25 mg by mouth as needed.    Historical Provider, MD    Allergies: Allergies  Allergen Reactions  . Penicillins     Reaction unknown.      Physical Exam: Vitals: Blood pressure 123/68, pulse 99, temperature 98.2 F (36.8 C), temperature source Oral, resp. rate 18, height  (1.651 m), weight 107.049 kg (236 lb), last menstrual period 10/12/2014.  Urine Dip Protein:  N/A  FHT: 130, moderate variability, +accels, no decels Toco: absent  General: NAD HEENT: normocephalic, anicteric Pulmonary: no increased work of breathing Abdomen: Gravid, non-tender Leopolds: vtx Genitourinary: deferred Extremities: no edema  Labs: No results found for this or any previous visit (from the past 24 hour(s)).  Assessment: 24 y.o. [id number] 7128w4d by 07/19/2015, with discomforts of pregnancy  Plan: 1) Pelvic pain - no evidence of labor, chronic component as well.   Patient reassured  2) Fetus - category I tracing  3) Disposition -  Discharge home has follow up in clinic

## 2015-05-28 NOTE — Final Progress Note (Signed)
Physician Final Progress Note  Patient ID: Abigail [name] D Kleen MRN: [id number] DOB/AGE: [birth year] 24 y.o.  Admit date: 05/28/2015 Admitting provider: No admitting provider for patient encounter. Discharge date: 05/28/2015   Admission Diagnoses:  Pelvic pain  Discharge Diagnoses:  Active Problems:   Pelvic pain affecting pregnancy  Consults: none  Significant Findings/ Diagnostic Studies: none  Procedures: NST  Discharge Condition: stable  Disposition: 01-Home or Self Care  Diet: no diet restriction  Discharge Activity: Activity as tolerated  Discharge Instructions    Discharge activity:  No Restrictions    Complete by:  As directed      Discharge diet:  No restrictions    Complete by:  As directed      No sexual activity restrictions    Complete by:  As directed             Medication List    STOP taking these medications        ibuprofen 200 MG tablet  Commonly known as:  ADVIL,MOTRIN     nitrofurantoin (macrocrystal-monohydrate) 100 MG capsule  Commonly known as:  MACROBID     oxyCODONE-acetaminophen 5-325 MG tablet  Commonly known as:  PERCOCET/ROXICET     venlafaxine 25 MG tablet  Commonly known as:  EFFEXOR      TAKE these medications        ondansetron 4 MG tablet  Commonly known as:  ZOFRAN  Take 4 mg by mouth every 8 (eight) hours as needed for nausea or vomiting.     PRENATAL VITAMINS PO  Take by mouth.     promethazine 25 MG tablet  Commonly known as:  PHENERGAN  Take 1 tablet (25 mg total) by mouth every 8 (eight) hours as needed for nausea or vomiting.     promethazine 25 MG suppository  Commonly known as:  PHENERGAN  Place 1 suppository (25 mg total) rectally every 6 (six) hours as needed for nausea.         Total time spent taking care of this patient: 15 minutes  Signed: Lorrene ReidSTAEBLER, Odean Mcelwain M 05/28/2015, 3:18 PM

## 2015-05-28 NOTE — OB Triage Note (Signed)
Presents with complaints of lower abd. Cramping. Denies any bleeding, change in discharge. Also denies any signs or symptoms of UTI. Pt was at work when cramping started.

## 2015-06-18 ENCOUNTER — Encounter: Payer: Self-pay | Admitting: *Deleted

## 2015-06-18 ENCOUNTER — Observation Stay
Admission: EM | Admit: 2015-06-18 | Discharge: 2015-06-18 | Disposition: A | Discharge status: Home / Self Care | Payer: Medicaid Other | Attending: Obstetrics and Gynecology | Admitting: Obstetrics and Gynecology

## 2015-06-18 DIAGNOSIS — M545 Low back pain, unspecified: Secondary | ICD-10-CM | POA: Diagnosis present

## 2015-06-18 DIAGNOSIS — O26893 Other specified pregnancy related conditions, third trimester: Principal | ICD-10-CM | POA: Insufficient documentation

## 2015-06-18 DIAGNOSIS — Z3A35 35 weeks gestation of pregnancy: Secondary | ICD-10-CM | POA: Diagnosis not present

## 2015-06-18 NOTE — H&P (Signed)
Obstetric H&P   Chief Complaint: Back Pain  Prenatal Care Provider: [facility]  History of Present Illness: 24 y.o. [id number] 3453w4d by 07/19/2015, by Last Menstrual Period presenting to L&D with lower back pain and contractions.  Back pain has been a chronic problem for the patient throughout this pregnancy, she also a a history of chronic pelvic pain with normal diagnostic laparoscopy by myself within the past year.   Pain consistent with lumbago of pregnancy have previously trialed flexeril, discussed tylenol, heat, and pregnancy support belts.  +FM, no LOF, no VB   Review of Systems: 10 point review of systems negative unless otherwise noted in HPI  Past Medical History: Past Medical History  Diagnosis Date  . Endometriosis   . Ovarian cyst     Past Surgical History: Past Surgical History  Procedure Laterality Date  . Iud removal    . Laproscopy w/ attempt to remove l cyst but cyst was no longer present Left 2015   Family History: Family History  Problem Relation Age of Onset  . Hypertension Mother   . Diabetes Mother   . Cancer Mother     Social History: Social History   Social History  . Marital Status: Single    Spouse Name: N/A  . Number of Children: N/A  . Years of Education: N/A   Occupational History  . Not on file.   Social History Main Topics  . Smoking status: Former Smoker -- 0.50 packs/day    Types: Cigarettes    Quit date: 02/18/2015  . Smokeless tobacco: Not on file  . Alcohol Use: No     Comment: occ  . Drug Use: No  . Sexual Activity: Yes    Birth Control/ Protection: None   Other Topics Concern  . Not on file   Social History Narrative    Medications: Prior to Admission medications   Medication Sig Start Date End Date Taking? Authorizing Provider  Prenatal Multivit-Min-Fe-FA (PRENATAL VITAMINS PO) Take by mouth.   Yes Historical Provider, MD  ibuprofen (ADVIL,MOTRIN) 200 MG tablet Take 200 mg by mouth every 6 (six) hours as needed for mild  pain.    Historical Provider, MD  nitrofurantoin, macrocrystal-monohydrate, (MACROBID) 100 MG capsule Take 1 capsule (100 mg total) by mouth 2 (two) times daily. Patient not taking: Reported on 02/21/2015 01/21/15   Irean HongJade J Sung, MD  ondansetron (ZOFRAN) 4 MG tablet Take 4 mg by mouth every 8 (eight) hours as needed for nausea or vomiting.    Historical Provider, MD  oxyCODONE-acetaminophen (PERCOCET/ROXICET) 5-325 MG per tablet Take 1-2 tablets by mouth every 4 (four) hours as needed for severe pain. Patient not taking: Reported on 02/21/2015 12/25/13   Elpidio AnisShari Upstill, PA-C  promethazine (PHENERGAN) 25 MG suppository Place 1 suppository (25 mg total) rectally every 6 (six) hours as needed for nausea. Patient not taking: Reported on 02/21/2015 12/13/14 12/13/15  Phineas SemenGraydon Goodman, MD  promethazine (PHENERGAN) 25 MG tablet Take 1 tablet (25 mg total) by mouth every 8 (eight) hours as needed for nausea or vomiting. Patient not taking: Reported on 02/21/2015 12/05/14   Chinita Pesterari B Triplett, FNP  venlafaxine (EFFEXOR) 25 MG tablet Take 25 mg by mouth as needed.    Historical Provider, MD    Allergies: Allergies  Allergen Reactions  . Penicillins     Reaction unknown.      Physical Exam: Filed Vitals:   06/18/15 1418  BP: 122/74  Pulse: 89    Urine Dip Protein:  N/A  FHT:  130, moderate variability, +accels, no decels Toco: absent  General: NAD HEENT: normocephalic, anicteric Pulmonary: no increased work of breathing Abdomen: Gravid, non-tender Leopolds: vtx Genitourinary: deferred Extremities: no edema MSK: paraspinal muscle tenderness, reproducible, no CVA tenderness  Labs: No results found for this or any previous visit (from the past 24 hour(s)).  Assessment: 24 y.o. Z6X0960 [redacted]w[redacted]d by 07/19/2015, with discomforts of pregnancy  Plan: 1) Lumbago- no evidence of labor, no CVA tenderness chronic component as well.   Patient reassured.  Continue prn supportive measures previously discussed  2) Fetus  - category I tracing  3) Disposition -  Discharge home has follow up in clinic

## 2015-06-18 NOTE — OB Triage Note (Signed)
5759w4d G5P1 pt c/o contractions and back pain.

## 2015-06-20 ENCOUNTER — Inpatient Hospital Stay
Admission: EM | Admit: 2015-06-20 | Discharge: 2015-06-22 | DRG: 767 | Disposition: A | Discharge status: Home / Self Care | Payer: Medicaid Other | Attending: Obstetrics & Gynecology | Admitting: Obstetrics & Gynecology

## 2015-06-20 ENCOUNTER — Other Ambulatory Visit: Payer: Self-pay | Admitting: Obstetrics and Gynecology

## 2015-06-20 DIAGNOSIS — E669 Obesity, unspecified: Secondary | ICD-10-CM | POA: Diagnosis present

## 2015-06-20 DIAGNOSIS — Z302 Encounter for sterilization: Secondary | ICD-10-CM | POA: Diagnosis not present

## 2015-06-20 DIAGNOSIS — Z6837 Body mass index (BMI) 37.0-37.9, adult: Secondary | ICD-10-CM | POA: Diagnosis not present

## 2015-06-20 DIAGNOSIS — Z3A35 35 weeks gestation of pregnancy: Secondary | ICD-10-CM

## 2015-06-20 DIAGNOSIS — O99214 Obesity complicating childbirth: Secondary | ICD-10-CM | POA: Diagnosis present

## 2015-06-20 DIAGNOSIS — F419 Anxiety disorder, unspecified: Secondary | ICD-10-CM | POA: Diagnosis present

## 2015-06-20 DIAGNOSIS — O42913 Preterm premature rupture of membranes, unspecified as to length of time between rupture and onset of labor, third trimester: Principal | ICD-10-CM | POA: Diagnosis present

## 2015-06-20 DIAGNOSIS — Z880 Allergy status to penicillin: Secondary | ICD-10-CM | POA: Diagnosis not present

## 2015-06-20 DIAGNOSIS — O99344 Other mental disorders complicating childbirth: Secondary | ICD-10-CM | POA: Diagnosis present

## 2015-06-20 LAB — TYPE AND SCREEN
ABO/RH(D): B POS
ANTIBODY SCREEN: NEGATIVE

## 2015-06-20 LAB — CBC
HEMATOCRIT: 32.8 % — AB (ref 35.0–47.0)
HEMOGLOBIN: 10.8 g/dL — AB (ref 12.0–16.0)
MCH: 28.7 pg (ref 26.0–34.0)
MCHC: 33 g/dL (ref 32.0–36.0)
MCV: 86.9 fL (ref 80.0–100.0)
Platelets: 344 10*3/uL (ref 150–440)
RBC: 3.77 MIL/uL — AB (ref 3.80–5.20)
RDW: 13 % (ref 11.5–14.5)
WBC: 12.7 10*3/uL — ABNORMAL HIGH (ref 3.6–11.0)

## 2015-06-20 LAB — RAPID HIV SCREEN (HIV 1/2 AB+AG)
HIV 1/2 Antibodies: NONREACTIVE
HIV-1 P24 ANTIGEN - HIV24: NONREACTIVE

## 2015-06-20 MED ORDER — LANOLIN HYDROUS EX OINT: TOPICAL_OINTMENT | CUTANEOUS | Status: DC | PRN

## 2015-06-20 MED ORDER — ACETAMINOPHEN 325 MG PO TABS: 650.0000 mg | ORAL_TABLET | ORAL | Status: DC | PRN

## 2015-06-20 MED ORDER — CEFAZOLIN SODIUM 1-5 GM-% IV SOLN
1.0000 g | Freq: Three times a day (TID) | INTRAVENOUS | Status: DC
  Filled 2015-06-20 (×2): qty 50

## 2015-06-20 MED ORDER — HYDROCODONE-ACETAMINOPHEN 5-325 MG PO TABS
1.0000 | ORAL_TABLET | ORAL | Status: DC | PRN
  Administered 2015-06-20 – 2015-06-21 (×5): 1 via ORAL
  Filled 2015-06-20 (×3): qty 1

## 2015-06-20 MED ORDER — OXYTOCIN 10 UNIT/ML IJ SOLN: 10.0000 [IU] | Freq: Once | INTRAMUSCULAR | Status: DC

## 2015-06-20 MED ORDER — LACTATED RINGERS IV SOLN: 500.0000 mL | INTRAVENOUS | Status: DC | PRN

## 2015-06-20 MED ORDER — OXYTOCIN 40 UNITS IN LACTATED RINGERS INFUSION - SIMPLE MED
INTRAVENOUS | Status: AC
  Administered 2015-06-20: 999 mL/h via INTRAVENOUS
  Filled 2015-06-20: qty 1000

## 2015-06-20 MED ORDER — CEFAZOLIN SODIUM-DEXTROSE 2-3 GM-% IV SOLR
2.0000 g | Freq: Once | INTRAVENOUS | Status: AC
  Administered 2015-06-20: 2 g via INTRAVENOUS

## 2015-06-20 MED ORDER — OXYTOCIN 40 UNITS IN LACTATED RINGERS INFUSION - SIMPLE MED
62.5000 mL/h | INTRAVENOUS | Status: DC | PRN
  Filled 2015-06-20: qty 1000

## 2015-06-20 MED ORDER — ONDANSETRON HCL 4 MG PO TABS
4.0000 mg | ORAL_TABLET | ORAL | Status: DC | PRN
Start: 2015-06-20 — End: 2015-06-22

## 2015-06-20 MED ORDER — SIMETHICONE 80 MG PO CHEW: 80.0000 mg | CHEWABLE_TABLET | ORAL | Status: DC | PRN

## 2015-06-20 MED ORDER — FENTANYL CITRATE (PF) 100 MCG/2ML IJ SOLN
50.0000 ug | INTRAMUSCULAR | Status: AC
  Administered 2015-06-20: 50 ug via INTRAVENOUS

## 2015-06-20 MED ORDER — OXYTOCIN BOLUS FROM INFUSION
500.0000 mL | INTRAVENOUS | Status: DC
Start: 2015-06-20 — End: 2015-06-20

## 2015-06-20 MED ORDER — WITCH HAZEL-GLYCERIN EX PADS: 1.0000 "application " | MEDICATED_PAD | CUTANEOUS | Status: DC | PRN

## 2015-06-20 MED ORDER — INFLUENZA VAC SPLIT QUAD 0.5 ML IM SUSY: 0.5000 mL | PREFILLED_SYRINGE | INTRAMUSCULAR | Status: DC

## 2015-06-20 MED ORDER — ONDANSETRON HCL 4 MG/2ML IJ SOLN: 4.0000 mg | Freq: Four times a day (QID) | INTRAMUSCULAR | Status: DC | PRN

## 2015-06-20 MED ORDER — PRENATAL MULTIVITAMIN CH
1.0000 | ORAL_TABLET | Freq: Every day | ORAL | Status: DC
  Administered 2015-06-22: 1 via ORAL
  Filled 2015-06-20: qty 1

## 2015-06-20 MED ORDER — FERROUS SULFATE 325 (65 FE) MG PO TABS
325.0000 mg | ORAL_TABLET | Freq: Two times a day (BID) | ORAL | Status: DC
  Administered 2015-06-20 – 2015-06-22 (×2): 325 mg via ORAL
  Filled 2015-06-20 (×2): qty 1

## 2015-06-20 MED ORDER — ONDANSETRON HCL 4 MG/2ML IJ SOLN
4.0000 mg | INTRAMUSCULAR | Status: DC | PRN
  Administered 2015-06-21: 4 mg via INTRAVENOUS
  Filled 2015-06-20: qty 2

## 2015-06-20 MED ORDER — HYDROCODONE-ACETAMINOPHEN 5-325 MG PO TABS
1.0000 | ORAL_TABLET | ORAL | Status: DC | PRN
  Administered 2015-06-21 – 2015-06-22 (×4): 1 via ORAL
  Filled 2015-06-20 (×6): qty 1

## 2015-06-20 MED ORDER — LACTATED RINGERS IV SOLN: INTRAVENOUS | Status: DC

## 2015-06-20 MED ORDER — IBUPROFEN 600 MG PO TABS
600.0000 mg | ORAL_TABLET | Freq: Four times a day (QID) | ORAL | Status: DC
  Administered 2015-06-20 – 2015-06-22 (×8): 600 mg via ORAL
  Filled 2015-06-20 (×8): qty 1

## 2015-06-20 MED ORDER — DIPHENHYDRAMINE HCL 25 MG PO CAPS
25.0000 mg | ORAL_CAPSULE | Freq: Four times a day (QID) | ORAL | Status: DC | PRN
Start: 2015-06-20 — End: 2015-06-22

## 2015-06-20 MED ORDER — INFLUENZA VAC SPLIT QUAD 0.5 ML IM SUSY
0.5000 mL | PREFILLED_SYRINGE | INTRAMUSCULAR | Status: AC | PRN
  Administered 2015-06-22: 0.5 mL via INTRAMUSCULAR
  Filled 2015-06-20: qty 0.5

## 2015-06-20 MED ORDER — FENTANYL CITRATE (PF) 100 MCG/2ML IJ SOLN
INTRAMUSCULAR | Status: AC
  Administered 2015-06-21: 25 ug via INTRAVENOUS
  Filled 2015-06-20: qty 2

## 2015-06-20 MED ORDER — CEFAZOLIN SODIUM-DEXTROSE 2-3 GM-% IV SOLR
INTRAVENOUS | Status: AC
  Filled 2015-06-20: qty 50

## 2015-06-20 MED ORDER — CITRIC ACID-SODIUM CITRATE 334-500 MG/5ML PO SOLN: 30.0000 mL | ORAL | Status: DC | PRN

## 2015-06-20 MED ORDER — OXYTOCIN 40 UNITS IN LACTATED RINGERS INFUSION - SIMPLE MED
62.5000 mL/h | INTRAVENOUS | Status: DC
  Administered 2015-06-20: 999 mL/h via INTRAVENOUS

## 2015-06-20 MED ORDER — DIBUCAINE 1 % RE OINT: 1.0000 "application " | TOPICAL_OINTMENT | RECTAL | Status: DC | PRN

## 2015-06-20 MED ORDER — BENZOCAINE-MENTHOL 20-0.5 % EX AERO
1.0000 "application " | INHALATION_SPRAY | CUTANEOUS | Status: DC | PRN
  Filled 2015-06-20: qty 56

## 2015-06-20 MED ORDER — MISOPROSTOL 200 MCG PO TABS
ORAL_TABLET | ORAL | Status: AC
  Filled 2015-06-20: qty 4

## 2015-06-20 MED ORDER — LIDOCAINE HCL (PF) 1 % IJ SOLN
30.0000 mL | INTRAMUSCULAR | Status: DC | PRN
  Filled 2015-06-20: qty 30

## 2015-06-20 MED ORDER — SENNOSIDES-DOCUSATE SODIUM 8.6-50 MG PO TABS
2.0000 | ORAL_TABLET | ORAL | Status: DC
  Administered 2015-06-20 – 2015-06-22 (×2): 2 via ORAL
  Filled 2015-06-20 (×2): qty 2

## 2015-06-20 NOTE — Progress Notes (Signed)
Pt expresses desire for permanent sterilization, this was not relayed to me earlier in the day (or to Dr Jean Rosenthaljackson at time of delivery).  Discussed pros and cons w patient.  Consent obtained in office and this has been discussed for months.  Will call and add on schedule for tomorrow, and see in am if able to be done.  If not, then may schedule as interval tubal.  The risks of bilateral tubal ligation discussed with the patient included but were not limited to: bleeding which may require transfusion or reoperation; infection which may require antibiotics; injury to bowel, bladder, ureters or other surrounding organs; need for additional procedures including hysterectomy in the event of a life-threatening hemorrhage, incisional problems, thromboembolic phenomenon and other postoperative/anesthesia complications. The patient concurred with the proposed plan, giving informed written consent for the procedure.   Pt informed that Dr Elesa MassedWard or Tiburcio PeaHarris to do procedure  Annamarie MajorPaul Sadhana Frater, MD

## 2015-06-20 NOTE — Discharge Summary (Signed)
OB Discharge Summary  Patient Name: Abigail [name] DOB: [birth year] MRN: [id number]  Date of admission: 06/20/2015 Delivering MD: Thomasene Mohair, MD Date of Delivery: 06/20/2015  Date of discharge: 06/22/2015  Admitting diagnosis:  1) Intrauterine pregnancy at [redacted]w[redacted]d  2) Supervision high risk pregnancy, third trimester 3) Obesity affecting pregnancy, third trimester 4) BMI 37 5) Preterm rupture of membranes  Intrauterine pregnancy: [redacted]w[redacted]d      Secondary diagnosis: anxiety     Discharge diagnosis: Preterm Pregnancy Delivered                                                                                                Post partum procedures:none  Augmentation: None  Complications: None  Hospital course:  Onset of Labor With Vaginal Delivery     24 y.o. yo [id number] at [redacted]w[redacted]d was admitted in Active Laboron 06/20/2015. Patient had an uncomplicated labor course as follows:  Membrane Rupture Time/Date: 2:30 AM ,06/20/2015   Intrapartum Procedures: Episiotomy: None [1]                                         Lacerations:  None [1]  Patient had a delivery of a Viable infant. 06/20/2015  Information for the patient's newborn:  Ailed, Defibaugh [[id number]]  Delivery Method: Vag-Spont     Pateint had an uncomplicated postpartum course.  She is ambulating, tolerating a regular diet, passing flatus, and urinating well. Patient is discharged home in stable condition on No discharge date for patient encounter.Marland Kitchen    Physical exam  Filed Vitals:   06/21/15 1928 06/21/15 2351 06/22/15 0521 06/22/15 0746  BP: 130/67 122/78 109/62 111/71  Pulse: 62 67 81 73  Temp: 97.7 F (36.5 C) 97.8 F (36.6 C)  98.7 F (37.1 C)  TempSrc: Oral Oral  Oral  Resp: Height:      Weight:      SpO2: 100%      General: alert, cooperative and no distress Lochia: appropriate Uterine Fundus: firm Incision: N/A DVT Evaluation: No evidence of DVT seen on physical exam.  Labs: Lab  Results  Component Value Date   WBC 14.4* 06/21/2015   HGB 10.3* 06/21/2015   HCT 32.2* 06/21/2015   MCV 88.6 06/21/2015   PLT 346 06/21/2015   CMP Latest Ref Rng 02/20/2015  Glucose 65 - 99 mg/dL 130(Q)  BUN 6 - 20 mg/dL 8  Creatinine 6.57 - 8.46 mg/dL 9.62  Sodium 952 - 841 mmol/L 132(L)  Potassium 3.5 - 5.1 mmol/L 3.5  Chloride 101 - 111 mmol/L 103  CO2 22 - 32 mmol/L 20(L)  Calcium 8.9 - 10.3 mg/dL 3.2(G)  Total Protein 6.5 - 8.1 g/dL 6.6  Total Bilirubin 0.3 - 1.2 mg/dL 4.0(N)  Alkaline Phos 38 - 126 U/L 86  AST 15 - 41 U/L 27  ALT 14 - 54 U/L 32    Discharge instruction: per After Visit Summary.  Medications:    Medication List    STOP taking these medications  cyclobenzaprine 5 MG tablet  Commonly known as:  FLEXERIL      TAKE these medications        ferrous sulfate 325 (65 FE) MG tablet  Take 1 tablet (325 mg total) by mouth 2 (two) times daily with a meal.     ibuprofen 600 MG tablet  Commonly known as:  ADVIL,MOTRIN  Take 1 tablet (600 mg total) by mouth every 6 (six) hours.     ondansetron 4 MG tablet  Commonly known as:  ZOFRAN  Take 4 mg by mouth every 8 (eight) hours as needed for nausea or vomiting.     oxyCODONE-acetaminophen 5-325 MG tablet  Commonly known as:  PERCOCET/ROXICET  Take 1-2 tablets by mouth every 6 (six) hours as needed.     PRENATAL VITAMINS PO  Take by mouth.     promethazine 25 MG tablet  Commonly known as:  PHENERGAN  Take 1 tablet (25 mg total) by mouth every 8 (eight) hours as needed for nausea or vomiting.     sertraline 50 MG tablet  Commonly known as:  ZOLOFT  Take 1 tablet (50 mg total) by mouth daily.        Diet: routine diet  Activity: Advance as tolerated. Pelvic rest for 6 weeks.   Outpatient follow up: No Follow-up on file.  Postpartum contraception: s/p BTL Rhogam Given postpartum: no Rubella vaccine given postpartum: no Varicella vaccine given postpartum: no TDaP given antepartum or  postpartum: yes Flu vaccine given antepartum or postpartum: no, given at discharge  Newborn Data: Live born unspecified sex  Birth Weight: 6 lb 2.1 oz (2780 g) APGAR: 8, 9   Baby Feeding: Breast  Disposition:home with mother  Signed: Vena AustriaAndreas Darlinda Bellows

## 2015-06-20 NOTE — H&P (Signed)
  Obstetric H&P   Chief Complaint: Leaking fluid  Prenatal Care Provider: [facility]  History of Present Illness: 24 y.o. [id number] at 2875w6d with  EDD 07/19/2015, by Last Menstrual Period presenting to L&D with a gush of pink-tinged vaginal fluid at 3am this morning.  She notes +FM, no frank vaginal bleeding.  She notes +FM. Marland Kitchen.   Review of Systems: 10 point review of systems negative unless otherwise noted in HPI  Past Medical History  Diagnosis Date  . Endometriosis   . Ovarian cyst    Past Surgical History  Procedure Laterality Date  . Iud removal    . Laproscopy w/ attempt to remove l cyst but cyst was no longer present Left 2015   Family History  Problem Relation Age of Onset  . Hypertension Mother   . Diabetes Mother   . Cancer Mother    Social History   Social History  . Marital Status: Single    Spouse Name: N/A  . Number of Children: N/A  . Years of Education: N/A   Occupational History  . Not on file.   Social History Main Topics  . Smoking status: Former Smoker -- 0.50 packs/day    Types: Cigarettes    Quit date: 02/18/2015  . Smokeless tobacco: Not on file  . Alcohol Use: No     Comment: occ  . Drug Use: No  . Sexual Activity: Yes    Birth Control/ Protection: None   Other Topics Concern  . Not on file   Social History Narrative    Medications: PNV Zoloft  Allergies: Allergies  Allergen Reactions  . Penicillins     Reaction unknown.     Physical Exam: Filed Vitals:   06/20/15 0531  BP: 124/87  Pulse: 78  Temp: 97.6 F (36.4 C)  Resp: 20   General: moderate distress with contractions HEENT: normocephalic, anicteric Pulmonary: no increased work of breathing Abdomen: Gravid, tender with contractions Leopolds: vtx Genitourinary: 5-6 cm per RN. Fetal membranes grossly ruptured Extremities: no edema  Assessment: 24 y.o. G9F6213G5P1031 at 7575w6d with EDD 07/19/2015, with SROM, preterm  Plan: Patient admitted and quickly went on to have SVD.  See  separate note.  Postpartum care from this point.

## 2015-06-21 ENCOUNTER — Encounter
Admission: EM | Disposition: A | Discharge status: Home / Self Care | Payer: Self-pay | Source: Home / Self Care | Attending: Obstetrics & Gynecology

## 2015-06-21 ENCOUNTER — Inpatient Hospital Stay: Payer: Medicaid Other | Admitting: Anesthesiology

## 2015-06-21 ENCOUNTER — Encounter: Payer: Self-pay | Admitting: *Deleted

## 2015-06-21 HISTORY — PX: TUBAL LIGATION: SHX77

## 2015-06-21 LAB — CBC
HEMATOCRIT: 32.2 % — AB (ref 35.0–47.0)
Hemoglobin: 10.3 g/dL — ABNORMAL LOW (ref 12.0–16.0)
MCH: 28.3 pg (ref 26.0–34.0)
MCHC: 31.9 g/dL — AB (ref 32.0–36.0)
MCV: 88.6 fL (ref 80.0–100.0)
Platelets: 346 10*3/uL (ref 150–440)
RBC: 3.63 MIL/uL — ABNORMAL LOW (ref 3.80–5.20)
RDW: 13.2 % (ref 11.5–14.5)
WBC: 14.4 10*3/uL — ABNORMAL HIGH (ref 3.6–11.0)

## 2015-06-21 LAB — RPR: RPR: NONREACTIVE

## 2015-06-21 SURGERY — LIGATION, FALLOPIAN TUBE, POSTPARTUM
Anesthesia: General | Wound class: Clean Contaminated

## 2015-06-21 MED ORDER — FENTANYL CITRATE (PF) 100 MCG/2ML IJ SOLN
INTRAMUSCULAR | Status: DC | PRN
  Administered 2015-06-21: 100 ug via INTRAVENOUS
  Administered 2015-06-21 (×2): 50 ug via INTRAVENOUS

## 2015-06-21 MED ORDER — PROPOFOL 10 MG/ML IV BOLUS
INTRAVENOUS | Status: DC | PRN
  Administered 2015-06-21: 200 mg via INTRAVENOUS

## 2015-06-21 MED ORDER — ACETAMINOPHEN 10 MG/ML IV SOLN
INTRAVENOUS | Status: DC | PRN
  Administered 2015-06-21: 1000 mg via INTRAVENOUS

## 2015-06-21 MED ORDER — GLYCOPYRROLATE 0.2 MG/ML IJ SOLN
INTRAMUSCULAR | Status: DC | PRN
  Administered 2015-06-21: .6 mg via INTRAVENOUS

## 2015-06-21 MED ORDER — LIDOCAINE HCL (CARDIAC) 20 MG/ML IV SOLN
INTRAVENOUS | Status: DC | PRN
  Administered 2015-06-21: 100 mg via INTRAVENOUS

## 2015-06-21 MED ORDER — HYDROMORPHONE HCL 1 MG/ML IJ SOLN
INTRAMUSCULAR | Status: DC | PRN
  Administered 2015-06-21 (×2): 0.5 mg via INTRAVENOUS

## 2015-06-21 MED ORDER — ROCURONIUM BROMIDE 100 MG/10ML IV SOLN
INTRAVENOUS | Status: DC | PRN
  Administered 2015-06-21: 10 mg via INTRAVENOUS
  Administered 2015-06-21: 35 mg via INTRAVENOUS
  Administered 2015-06-21: 5 mg via INTRAVENOUS

## 2015-06-21 MED ORDER — NEOSTIGMINE METHYLSULFATE 10 MG/10ML IV SOLN
INTRAVENOUS | Status: DC | PRN
  Administered 2015-06-21: 3.5 mg via INTRAVENOUS

## 2015-06-21 MED ORDER — FENTANYL CITRATE (PF) 100 MCG/2ML IJ SOLN: 25.0000 ug | INTRAMUSCULAR | Status: DC | PRN

## 2015-06-21 MED ORDER — BUPIVACAINE HCL (PF) 0.5 % IJ SOLN
INTRAMUSCULAR | Status: AC
  Filled 2015-06-21: qty 30

## 2015-06-21 MED ORDER — BISACODYL 10 MG RE SUPP
10.0000 mg | Freq: Once | RECTAL | Status: AC
  Administered 2015-06-21: 10 mg via RECTAL
  Filled 2015-06-21: qty 1

## 2015-06-21 MED ORDER — ONDANSETRON HCL 4 MG/2ML IJ SOLN: 4.0000 mg | Freq: Once | INTRAMUSCULAR | Status: DC | PRN

## 2015-06-21 MED ORDER — FENTANYL CITRATE (PF) 100 MCG/2ML IJ SOLN
INTRAMUSCULAR | Status: AC
  Administered 2015-06-21: 25 ug via INTRAVENOUS
  Filled 2015-06-21: qty 2

## 2015-06-21 MED ORDER — FENTANYL CITRATE (PF) 100 MCG/2ML IJ SOLN
25.0000 ug | INTRAMUSCULAR | Status: AC | PRN
  Administered 2015-06-21 (×6): 25 ug via INTRAVENOUS

## 2015-06-21 MED ORDER — BUPIVACAINE HCL 0.5 % IJ SOLN
INTRAMUSCULAR | Status: DC | PRN
  Administered 2015-06-21: 10 mL

## 2015-06-21 MED ORDER — ONDANSETRON HCL 4 MG/2ML IJ SOLN
INTRAMUSCULAR | Status: DC | PRN
  Administered 2015-06-21: 4 mg via INTRAVENOUS

## 2015-06-21 MED ORDER — LACTATED RINGERS IV SOLN
INTRAVENOUS | Status: DC
  Administered 2015-06-21: 01:00:00 via INTRAVENOUS

## 2015-06-21 MED ORDER — ACETAMINOPHEN 10 MG/ML IV SOLN
INTRAVENOUS | Status: AC
  Filled 2015-06-21: qty 100

## 2015-06-21 SURGICAL SUPPLY — 23 items
CHLORAPREP W/TINT 26ML (MISCELLANEOUS) ×2 IMPLANT
DERMABOND ADVANCED (GAUZE/BANDAGES/DRESSINGS) ×1
DERMABOND ADVANCED .7 DNX12 (GAUZE/BANDAGES/DRESSINGS) ×1 IMPLANT
DRAPE LAPAROTOMY T 102X78X121 (DRAPES) ×2 IMPLANT
DRESSING TELFA 4X3 1S ST N-ADH (GAUZE/BANDAGES/DRESSINGS) IMPLANT
DRSG TEGADERM 2-3/8X2-3/4 SM (GAUZE/BANDAGES/DRESSINGS) IMPLANT
ELECT CAUTERY BLADE 6.4 (BLADE) ×2 IMPLANT
GLOVE BIO SURGEON STRL SZ8 (GLOVE) ×10 IMPLANT
GOWN STRL REUS W/ TWL LRG LVL3 (GOWN DISPOSABLE) ×1 IMPLANT
GOWN STRL REUS W/ TWL XL LVL3 (GOWN DISPOSABLE) ×1 IMPLANT
GOWN STRL REUS W/TWL LRG LVL3 (GOWN DISPOSABLE) ×1
GOWN STRL REUS W/TWL XL LVL3 (GOWN DISPOSABLE) ×1
LABEL OR SOLS (LABEL) ×2 IMPLANT
NDL SAFETY 22GX1.5 (NEEDLE) ×2 IMPLANT
NS IRRIG 500ML POUR BTL (IV SOLUTION) ×2 IMPLANT
PACK BASIN MINOR ARMC (MISCELLANEOUS) ×2 IMPLANT
PAD GROUND ADULT SPLIT (MISCELLANEOUS) ×2 IMPLANT
STRAP SAFETY BODY (MISCELLANEOUS) ×2 IMPLANT
SUT PLAIN GUT (SUTURE) ×4 IMPLANT
SUT VIC AB 0 SH 27 (SUTURE) ×2 IMPLANT
SUT VIC AB 2-0 UR6 27 (SUTURE) ×2 IMPLANT
SUT VIC AB 4-0 PS2 18 (SUTURE) IMPLANT
SYRINGE 10CC LL (SYRINGE) ×2 IMPLANT

## 2015-06-21 NOTE — Anesthesia Procedure Notes (Signed)
Procedure Name: Intubation Date/Time: 06/21/2015 3:17 PM Performed by: Shirlee LimerickMARION, Curt Oatis Pre-anesthesia Checklist: Patient identified, Emergency Drugs available, Suction available and Patient being monitored Patient Re-evaluated:Patient Re-evaluated prior to inductionOxygen Delivery Method: Circle system utilized Preoxygenation: Pre-oxygenation with 100% oxygen Intubation Type: IV induction Laryngoscope Size: Mac and 3 Grade View: Grade I Tube type: Oral Tube size: 7.0 mm Number of attempts: 1 Secured at: 21 cm Tube secured with: Tape Dental Injury: Teeth and Oropharynx as per pre-operative assessment

## 2015-06-21 NOTE — Anesthesia Preprocedure Evaluation (Signed)
Anesthesia Evaluation  Patient identified by MRN, date of birth, ID band Patient awake    Reviewed: Allergy & Precautions, H&P , NPO status , Patient's Chart, lab work & pertinent test results, reviewed documented beta blocker date and time   Airway Mallampati: II  TM Distance: >3 FB Neck ROM: full    Dental  (+) Teeth Intact   Pulmonary neg pulmonary ROS, Current Smoker, former smoker,    Pulmonary exam normal        Cardiovascular negative cardio ROS Normal cardiovascular exam Rhythm:regular Rate:Normal     Neuro/Psych negative neurological ROS  negative psych ROS   GI/Hepatic negative GI ROS, Neg liver ROS,   Endo/Other  negative endocrine ROS  Renal/GU negative Renal ROS  negative genitourinary   Musculoskeletal   Abdominal   Peds  Hematology negative hematology ROS (+)   Anesthesia Other Findings   Reproductive/Obstetrics negative OB ROS                             Anesthesia Physical Anesthesia Plan  ASA: II and emergent  Anesthesia Plan: General ETT   Post-op Pain Management:    Induction:   Airway Management Planned:   Additional Equipment:   Intra-op Plan:   Post-operative Plan:   Informed Consent: I have reviewed the patients History and Physical, chart, labs and discussed the procedure including the risks, benefits and alternatives for the proposed anesthesia with the patient or authorized representative who has indicated his/her understanding and acceptance.     Plan Discussed with: CRNA  Anesthesia Plan Comments:         Anesthesia Quick Evaluation

## 2015-06-21 NOTE — Progress Notes (Signed)
Post Partum Day 1 Subjective: Doing well, no complaints.  Tolerating pain.  Tolerated regular diet; has been NPO past midnight for PPTL today. voiding and ambulating without difficulty.  No CP SOB F/C N/V or leg pain no HA change of vision, RUQ/epigastric pain  Objective: BP: 124/77, Pulse: 72, RR 20, O2 99%, temp 98.0 Physical Exam:  General: NAD CV: RRR Pulm: nl effort, CTABL Lochia: moderate Uterine Fundus: fundus firm and below umbilicus DVT Evaluation: no cords, ttp LEs    Recent Labs  06/20/15 0624 06/21/15 0503  HGB 10.8* 10.3*  HCT 32.8* 32.2*  WBC 12.7* 14.4*  PLT 344 346    Assessment/Plan: 24 y.o. Z6X0960G5P1131 postpartum day # 1 s/p preterm SVD  1. Post partum:  Doing well, breastfeeding and pumping for bottle feeds.   2. PPTL on schedule for this afternoon, continue NPO.  Will try to do earlier than scheduled  3. Anticipate d/c tomorrow.    ----- Ranae Plumberhelsea Ward, MD Attending Obstetrician and Gynecologist [facility]

## 2015-06-21 NOTE — Transfer of Care (Signed)
Immediate Anesthesia Transfer of Care Note  Patient: Abigail [name]  Procedure(s) Performed: Procedure(s): POST PARTUM TUBAL LIGATION  Patient Location: PACU  Anesthesia Type:General  Level of Consciousness: awake, alert , oriented and patient cooperative  Airway & Oxygen Therapy: Patient Spontanous Breathing and Patient connected to nasal cannula oxygen  Post-op Assessment: Report given to RN  Post vital signs: Reviewed and stable  Last Vitals:  Filed Vitals:   06/21/15 1344 06/21/15 1639  BP: 135/59 90/73  Pulse:  83  Temp:  36.5 C  Resp:  13    Complications: No apparent anesthesia complications

## 2015-06-21 NOTE — Lactation Note (Signed)
This note was copied from the chart of Abigail [name] Schley. Lactation Consultation Note  Patient Name: Abigail [name] Zern RUEAV'WToday's Date: 06/21/2015 Reason for consult: Initial assessment   Maternal Data   Mother has breast fed her first baby for 1 or 2 months and then stopped. She seems to have her own plan for feeding. Earlier today we had sent a time for the baby to go to the breast around 11 am. When I returned to the room at 11 am mother was feeding the baby formula.  She stated she had guests and did not want to breast feed with them there. We talked a bout how the body makes milk and why she needs to keep putting the baby to the breast and or pump to make milk.  I observed a pumping and the pump is well fitting. [facility] Mother will be having surgery today so The next feeding is not likely to be a breast feeding.                             Lactation Tools Discussed/Used     Consult Status      Trudee GripCarolyn P Falana Clagg 06/21/2015, 1:12 PM

## 2015-06-21 NOTE — Op Note (Signed)
  Post Partum Tubal Ligation  Indication for procedure: desired permanent sterilization  Pre-op diagnosis: s/p term vaginal delivery, desired permanent sterilization Post-op diagnosis: same Procedure: post partum bilateral tubal ligation via [place] method Surgeon: Chelsea Ward Assist: none Anesthesia: general  IVF: 500 EBL: minimal UOP: none Findings: patent bilateral tubes, normal post-gravid uterine fundus Specimens: portion of left tube, portion of right tube Complications: none apparent Disposition: stable to PACU  Procedure in detail: The patient was seen and confirmed desire for permanent sterilization.  She was identified as Abigail [name] and was brought to the OR.  General anesthesia was administered, and the patient was prepped and draped in the usual sterile fashion.  A surgical time-out was called. An 11-blade was used to incise the skin in the umbilical area.  The subcutaneous tissues were dissected to and then through the fascia, and the peritoneum was grasped and divided.  An alexis retractor was placed in the abdominal cavity and positioned.  The uterus was identified, and the right tube was grasped with a babcock and traced to the fimbriated end.  The middle of the isthmus was grasped, tented upward, and the bovie was used to create a window into which the suture of plain-gut was threaded.  Both the distal and proxmial ends were tied down and the portion of tube between was transected with scissors and handed off to nursing as portion of right tube.  The left tube was ligated and transected in the same fashion.  All dissected ends were found to be hemostatic.  The alexis retractor was removed, and the fascia was reapproximated with 2-0 vicryl.  The subcutaneous tissue was irrigated and then the skin was closed with 4-0 monocryl.  A sterile dressing was applied.  The sponge, needle, and instrument counts were correct x2.  The patient tolerated the procedure and was brought  to PACU in a stable condition.  Ranae Plumberhelsea Ward, MD Attending Obstetrician and Gynecologist [facility]

## 2015-06-22 ENCOUNTER — Encounter: Payer: Self-pay | Admitting: Obstetrics & Gynecology

## 2015-06-22 MED ORDER — SERTRALINE HCL 50 MG PO TABS: 50.0000 mg | ORAL_TABLET | Freq: Every day | ORAL | Status: DC

## 2015-06-22 MED ORDER — FERROUS SULFATE 325 (65 FE) MG PO TABS: 325.0000 mg | ORAL_TABLET | Freq: Two times a day (BID) | ORAL | Status: DC

## 2015-06-22 MED ORDER — IBUPROFEN 600 MG PO TABS: 600.0000 mg | ORAL_TABLET | Freq: Four times a day (QID) | ORAL | Status: DC

## 2015-06-22 MED ORDER — OXYCODONE-ACETAMINOPHEN 5-325 MG PO TABS: 1.0000 | ORAL_TABLET | Freq: Four times a day (QID) | ORAL | Status: DC | PRN

## 2015-06-22 NOTE — Discharge Instructions (Signed)
Vaginal Delivery, Care After Refer to this sheet in the next few weeks. These discharge instructions provide you with information on caring for yourself after delivery. Your health care provider may also give you specific instructions. Your treatment has been planned according to the most current medical practices available, but problems sometimes occur. Call your health care provider if you have any problems or questions after you go home. HOME CARE INSTRUCTIONS  Take over-the-counter or prescription medicines only as directed by your health care provider or pharmacist.  Do not drink alcohol, especially if you are breastfeeding or taking medicine to relieve pain.  Do not chew or smoke tobacco.  Do not use illegal drugs.  Continue to use good perineal care. Good perineal care includes:  Wiping your perineum from front to back.  Keeping your perineum clean.  Do not use tampons or douche for the next 6 weeks.   Showers only, no tub baths for the next 6 weeks.   Wear a well-fitting bra that provides breast support.  Eat healthy foods.  Drink enough fluids to keep your urine clear or pale yellow.  Eat high-fiber foods such as whole grain cereals and breads, brown rice, beans, and fresh fruits and vegetables every day. These foods may help prevent or relieve constipation.  Follow your health care provider's recommendations regarding resumption of activities such as climbing stairs, driving, lifting, exercising, or traveling.  No sexual intercourse for the next 6 weeks and then not until you feel ready.   Try to have someone help you with your household activities and your newborn for at least a few days after you leave the hospital.  Rest as much as possible. Try to rest or take a nap when your newborn is sleeping.  Increase your activities gradually.  Keep all of your scheduled postpartum appointments. It is very important to keep your scheduled follow-up appointments. At these  appointments, your health care provider will be checking to make sure that you are healing physically and emotionally. SEEK MEDICAL CARE IF:   You are passing large clots from your vagina. Save any clots to show your health care provider.  You have a foul smelling discharge from your vagina.  You have trouble urinating.  You are urinating frequently.  You have pain when you urinate.  You have a change in your bowel movements.  You have painful, hard, or reddened breasts.  You have a severe headache.  You have blurred vision or see spots.  You feel sad or depressed.  You have thoughts of hurting yourself or your newborn.  You have questions about your care, the care of your newborn, or medicines.  You are dizzy or light-headed.  You have a rash.  You have nausea or vomiting.  You were breastfeeding and have not had a menstrual period within 12 weeks after you stopped breastfeeding.  You are not breastfeeding and have not had a menstrual period by the 12th week after delivery.  You have a fever. SEEK IMMEDIATE MEDICAL CARE IF:   You have persistent pain.  You have chest pain.  You have shortness of breath.  You faint.  You have leg pain.  You have stomach pain.  Your vaginal bleeding saturates two or more sanitary pads in 1 hour.   This information is not intended to replace advice given to you by your health care provider. Make sure you discuss any questions you have with your health care provider.   Document Released: 07/04/2000 Document Revised: 03/28/2015  Document Reviewed: 03/03/2012 °Elsevier Interactive Patient Education ©2016 [employer] ° °

## 2015-06-22 NOTE — Progress Notes (Signed)
Patient discharged home with infant. Vital signs stable, bleeding within normal limits, uterus firm. Discharge instructions, prescriptions, and follow up appointment given to and reviewed with patient. Patient verbalized understanding, all questions answered. Escorted in wheelchair by auxiliary.    

## 2015-06-25 LAB — SURGICAL PATHOLOGY

## 2015-06-26 NOTE — Anesthesia Postprocedure Evaluation (Signed)
Anesthesia Post Note  Patient: Abigail LevelWhitney D [name]  Procedure(s) Performed: Procedure(s): POST PARTUM TUBAL LIGATION  Patient location during evaluation: PACU Anesthesia Type: General [name] of consciousness: awake Pain management: pain [name] controlled Vital Signs Assessment: post-procedure vital signs reviewed and stable Respiratory status: spontaneous breathing Cardiovascular status: blood pressure returned to baseline Anesthetic complications: no    Last Vitals:  Filed Vitals:   06/22/15 0521 06/22/15 0746  BP: 109/62 111/71  Pulse: 81 73  Temp:  37.1 C  Resp: 20 18    Last Pain:  Filed Vitals:   06/22/15 1528  PainSc: 0-No pain                 Reesha Debes S

## 2016-05-07 IMAGING — US US OB < 14 WEEKS - US OB TV
1 series · 14 of 28 positions shown · non-contrast
Comparison: None.

CLINICAL DATA: Light To moderate vaginal bleeding, 6 weeks and 3
days pregnancy by last menstrual period. Beta HCG 302.

EXAM:
OBSTETRIC <14 WK US AND TRANSVAGINAL OB US
TECHNIQUE: Both transabdominal and transvaginal ultrasound examinations were
performed for complete evaluation of the gestation as well as the
maternal uterus, adnexal regions, and pelvic cul-de-sac.
Transvaginal technique was performed to assess early pregnancy.

[Series 1: us ob < 14 weeks - us ob tv · 0.24mm/px · 36 acquisitions, 14 frames shown]
[im 2/36]
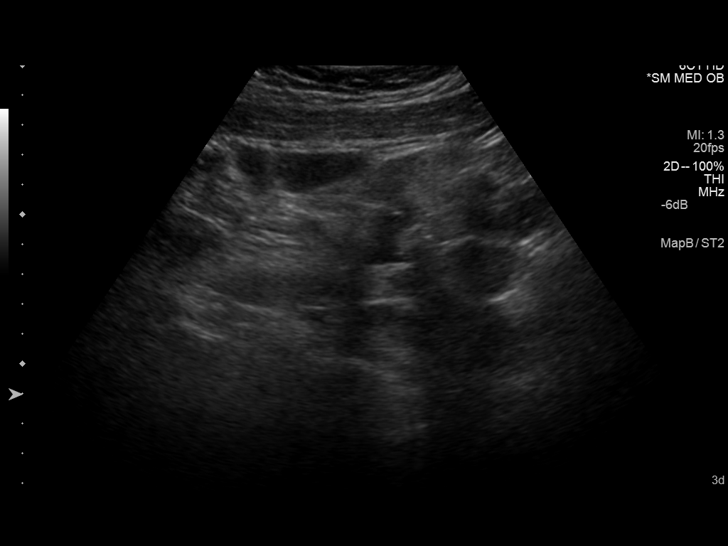
[im 4/36]
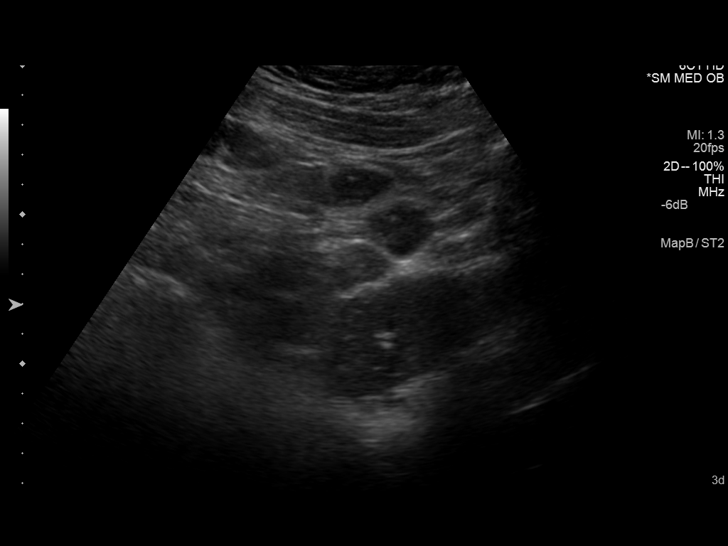
[im 7/36]
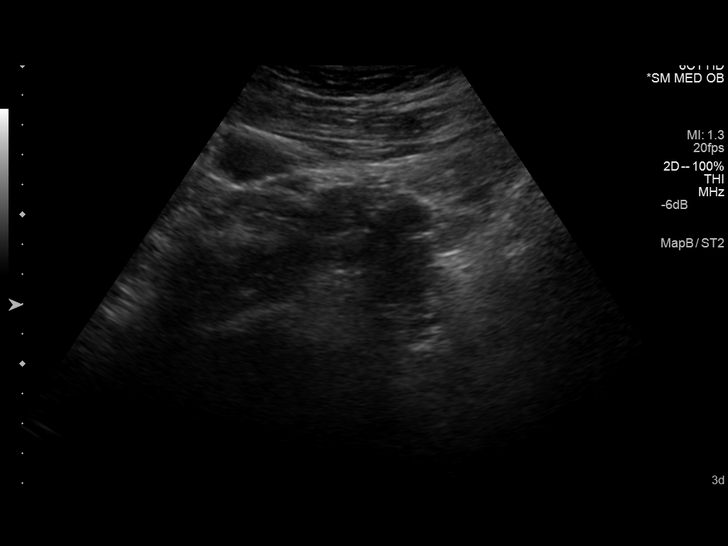
[im 10/36]
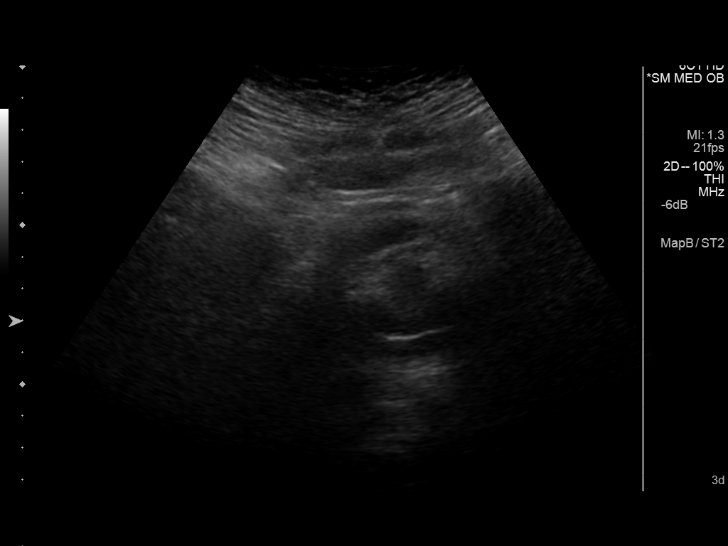
[im 12/36]
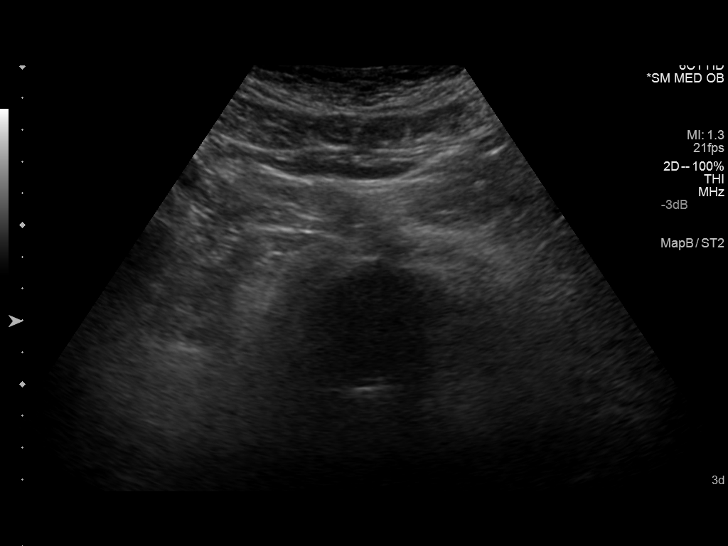
[im 15/36]
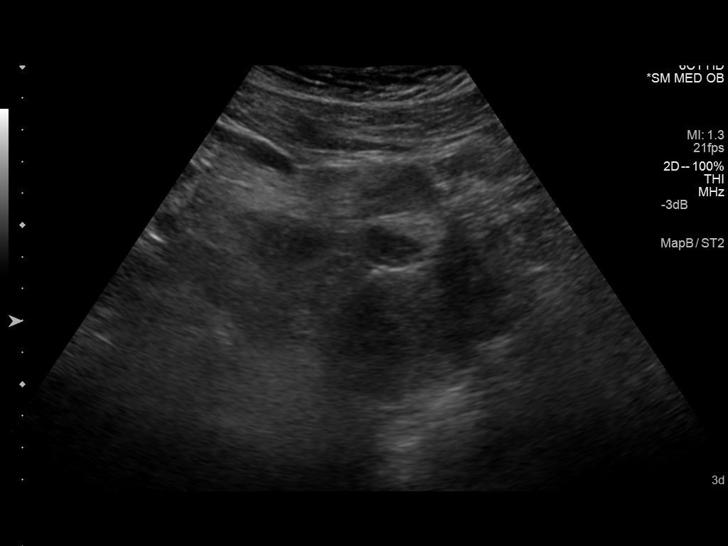
[im 17/36]
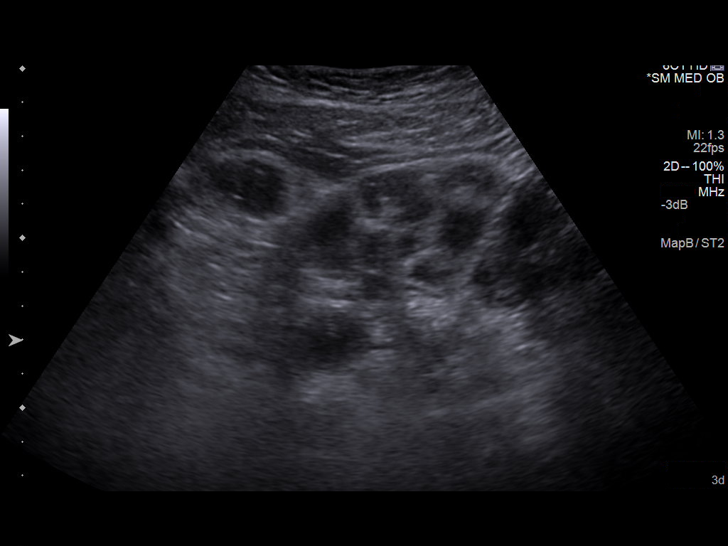
[im 20/36]
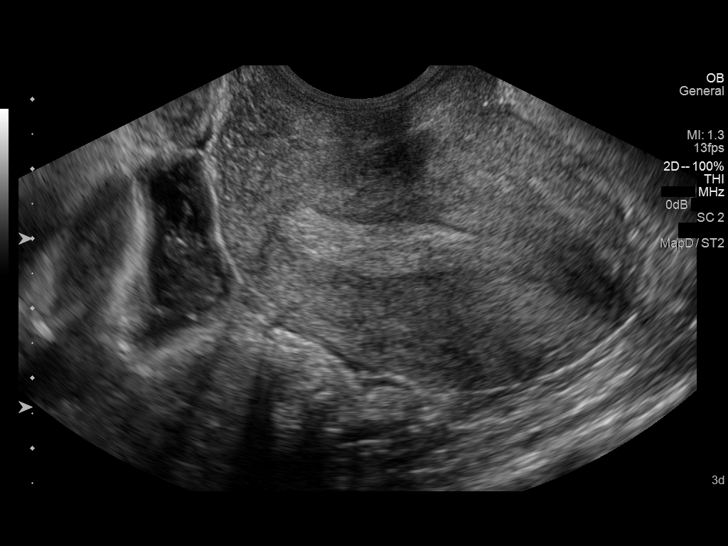
[im 23/36]
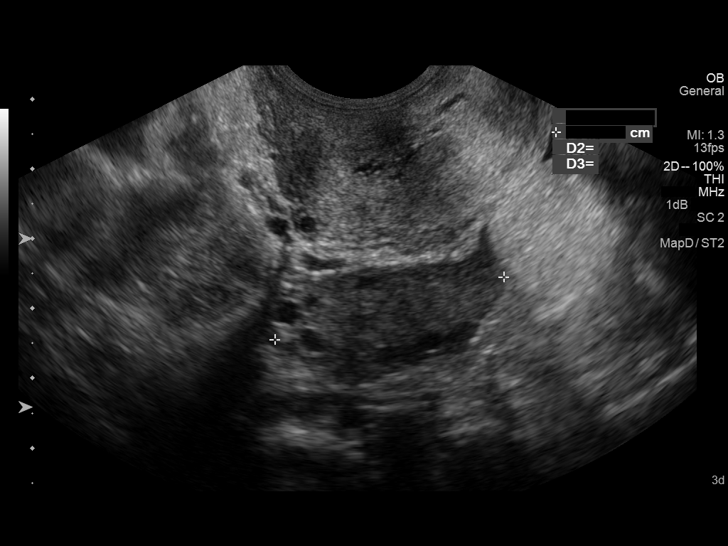
[im 25/36]
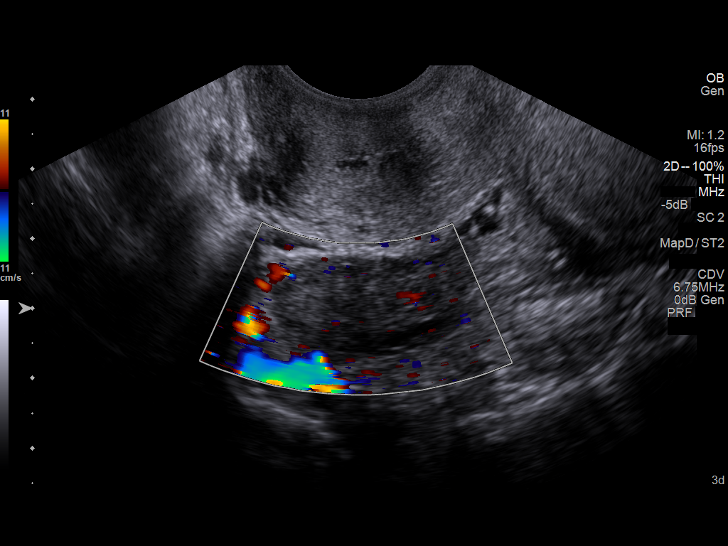
[im 28/36]
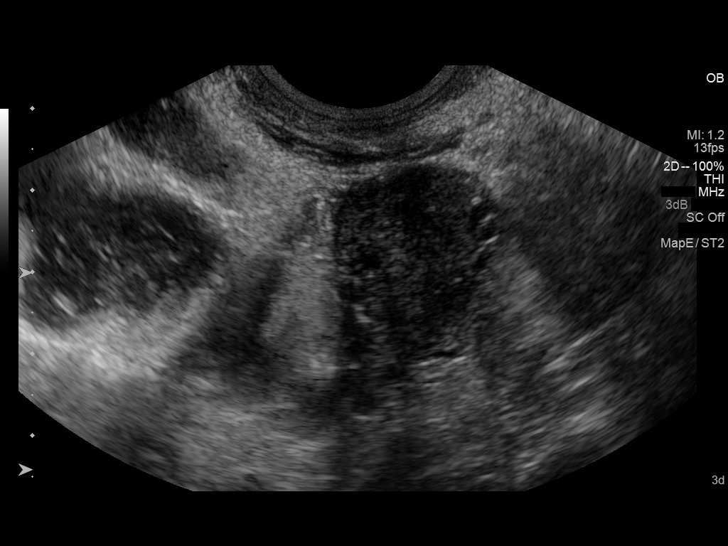
[im 30/36]
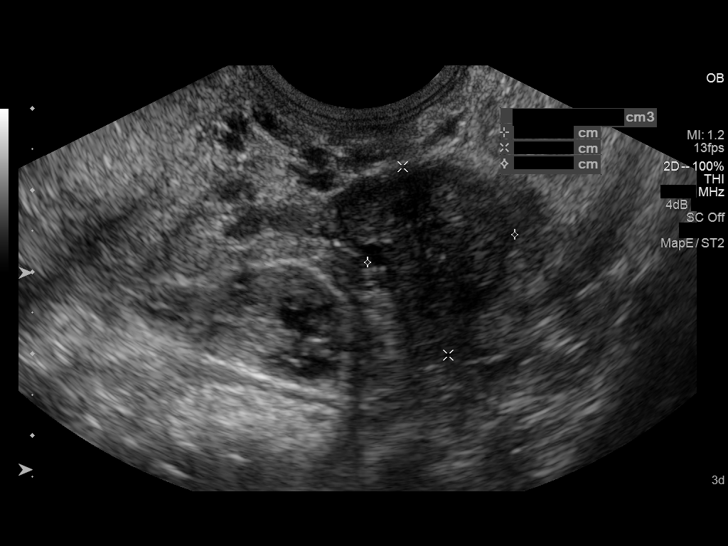
[im 33/36]
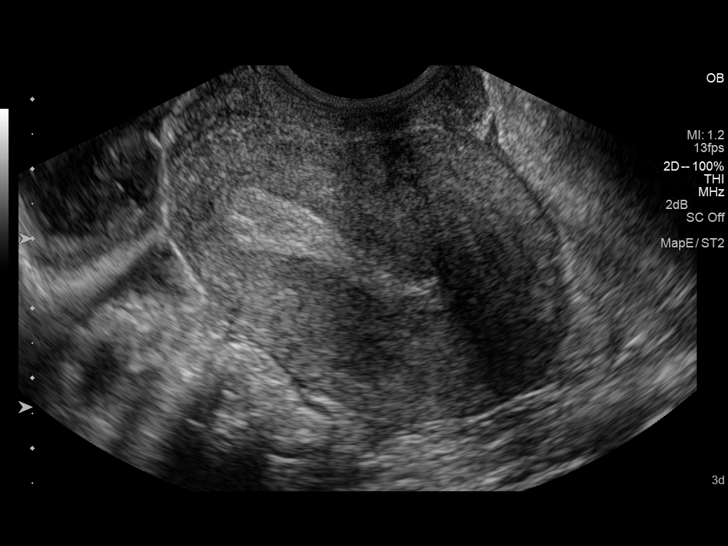
[im 36/36]
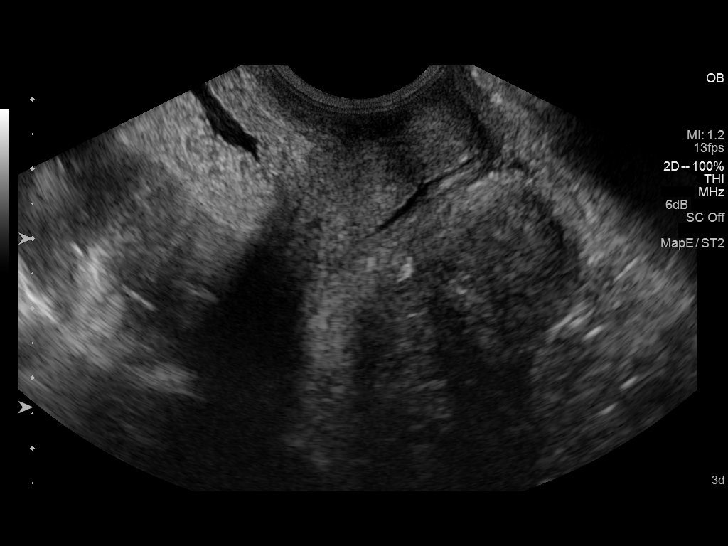

[14 of 28 positions shown; findings below may reference images not displayed]

FINDINGS: Intrauterine gestational sac: Not identified

Yolk sac:  Not identified

Embryo:  Identified

Cardiac Activity: Not applicable

Maternal uterus/adnexae: Normal appearance of the adnexae. Trace
free fluid in the cervix. Endometrium is 7 mm demonstrate
homogeneous echogenicity without increased vascularity.
IMPRESSION: No sonographic findings of intrauterine pregnancy. This could be
related to early gestational age considering low beta HCG or, failed
pregnancy. Recommend serial HCG and follow-up sonogram as clinically
indicated.

  By: Tundjay Nebieva

## 2016-07-07 ENCOUNTER — Encounter: Payer: Self-pay | Admitting: Emergency Medicine

## 2016-07-07 ENCOUNTER — Emergency Department
Admission: EM | Admit: 2016-07-07 | Discharge: 2016-07-07 | Disposition: A | Discharge status: Home / Self Care | Payer: Medicaid Other | Attending: Emergency Medicine | Admitting: Emergency Medicine

## 2016-07-07 DIAGNOSIS — Z87891 Personal history of nicotine dependence: Secondary | ICD-10-CM | POA: Insufficient documentation

## 2016-07-07 DIAGNOSIS — N760 Acute vaginitis: Secondary | ICD-10-CM | POA: Diagnosis not present

## 2016-07-07 DIAGNOSIS — B3731 Acute candidiasis of vulva and vagina: Secondary | ICD-10-CM

## 2016-07-07 DIAGNOSIS — N898 Other specified noninflammatory disorders of vagina: Secondary | ICD-10-CM | POA: Diagnosis present

## 2016-07-07 DIAGNOSIS — B373 Candidiasis of vulva and vagina: Secondary | ICD-10-CM | POA: Insufficient documentation

## 2016-07-07 DIAGNOSIS — B9689 Other specified bacterial agents as the cause of diseases classified elsewhere: Secondary | ICD-10-CM

## 2016-07-07 LAB — URINALYSIS, COMPLETE (UACMP) WITH MICROSCOPIC
BILIRUBIN URINE: NEGATIVE
Glucose, UA: NEGATIVE mg/dL
HGB URINE DIPSTICK: NEGATIVE
KETONES UR: 5 mg/dL — AB
NITRITE: NEGATIVE
PROTEIN: NEGATIVE mg/dL
SPECIFIC GRAVITY, URINE: 1.03 (ref 1.005–1.030)
pH: 5 (ref 5.0–8.0)

## 2016-07-07 LAB — CHLAMYDIA/NGC RT PCR (ARMC ONLY)
Chlamydia Tr: NOT DETECTED
N GONORRHOEAE: NOT DETECTED

## 2016-07-07 LAB — WET PREP, GENITAL
Clue Cells Wet Prep HPF POC: NONE SEEN
SPERM: NONE SEEN
Trich, Wet Prep: NONE SEEN
WBC, Wet Prep HPF POC: NONE SEEN
YEAST WET PREP: NONE SEEN

## 2016-07-07 LAB — POCT PREGNANCY, URINE: PREG TEST UR: NEGATIVE

## 2016-07-07 MED ORDER — METRONIDAZOLE 500 MG PO TABS: 500.0000 mg | ORAL_TABLET | Freq: Two times a day (BID) | ORAL | 0 refills | Status: DC

## 2016-07-07 MED ORDER — FLUCONAZOLE 150 MG PO TABS: 150.0000 mg | ORAL_TABLET | ORAL | 0 refills | Status: DC

## 2016-07-07 NOTE — ED Provider Notes (Signed)
[facility] Emergency Department Provider Note  ____________________________________________  Time seen: Approximately 3:28 PM  I have reviewed the triage vital signs and the nursing notes.   HISTORY  Chief Complaint Vaginal Itching    HPI Abigail [name] is a 25 y.o. female who presents emergency department complaining of vaginal itching 1 week. Patient states that symptoms began insidiously but have increased over the intervening period. Patient reports that initially itching was more in the vagina but is also now around the opening. Patient reports that she has been scratching at the area and using topicals with no relief. Patient states that she has endometriosis and occasionally has suprapubic pain from same. She reports that she has had occasional sharp pain in the suprapubic region but states that this is consistent with her endometriosis pain. She denies any frank vaginal discharge or bleeding. She denies any odors. No other complaints at this time.   Past Medical History:  Diagnosis Date  . Endometriosis   . Ovarian cyst     Patient Active Problem List   Diagnosis Date Noted  . Labor and delivery, indication for care 06/20/2015  . Lumbago 06/18/2015  . Pelvic pain affecting pregnancy 05/28/2015  . Abdominal pain affecting pregnancy 04/12/2015    Past Surgical History:  Procedure Laterality Date  . IUD REMOVAL    . laproscopy w/ attempt to remove L cyst but cyst was no longer present Left 2015  . TUBAL LIGATION  06/21/2015   Procedure: POST PARTUM TUBAL LIGATION;  Surgeon: Elenora Fender Ward, MD;  Location: [facility] ORS;  Service: Gynecology;;    Prior to Admission medications   Medication Sig Start Date End Date Taking? Authorizing Provider  ferrous sulfate 325 (65 FE) MG tablet Take 1 tablet (325 mg total) by mouth 2 (two) times daily with a meal. 06/22/15   Vena Austria, MD  fluconazole (DIFLUCAN) 150 MG tablet Take 1 tablet (150 mg total) by  mouth once a week. Take 1 tablet on day 1; take second tablet a week later after finishing antibiotics 07/07/16   Delorise Royals Cuthriell, PA-C  ibuprofen (ADVIL,MOTRIN) 600 MG tablet Take 1 tablet (600 mg total) by mouth every 6 (six) hours. 06/22/15   Vena Austria, MD  metroNIDAZOLE (FLAGYL) 500 MG tablet Take 1 tablet (500 mg total) by mouth 2 (two) times daily. 07/07/16   Delorise Royals Cuthriell, PA-C  ondansetron (ZOFRAN) 4 MG tablet Take 4 mg by mouth every 8 (eight) hours as needed for nausea or vomiting.    Historical Provider, MD  oxyCODONE-acetaminophen (PERCOCET/ROXICET) 5-325 MG tablet Take 1-2 tablets by mouth every 6 (six) hours as needed. 06/22/15   Vena Austria, MD  Prenatal Multivit-Min-Fe-FA (PRENATAL VITAMINS PO) Take by mouth.    Historical Provider, MD  promethazine (PHENERGAN) 25 MG tablet Take 1 tablet (25 mg total) by mouth every 8 (eight) hours as needed for nausea or vomiting. 12/05/14   Chinita Pester, FNP  sertraline (ZOLOFT) 50 MG tablet Take 1 tablet (50 mg total) by mouth daily. 06/22/15   Vena Austria, MD    Allergies Penicillins  Family History  Problem Relation Age of Onset  . Hypertension Mother   . Diabetes Mother   . Cancer Mother     Social History Social History  Substance Use Topics  . Smoking status: Former Smoker    Packs/day: 0.50    Types: Cigarettes    Quit date: 02/18/2015  . Smokeless tobacco: Not on file  . Alcohol use No  Comment: occ     Review of Systems  Constitutional: No fever/chills Cardiovascular: no chest pain. Respiratory: no cough. No SOB. Gastrointestinal: No abdominal pain.  No nausea, no vomiting.  No diarrhea.  No constipation. Genitourinary: Negative for dysuria. No hematuria. Positive for vaginal itching. Denies vaginal discharge or bleeding. Musculoskeletal: Negative for musculoskeletal pain. Skin: Negative for rash, abrasions, lacerations, ecchymosis. Neurological: Negative for headaches, focal weakness  or numbness. 10-point ROS otherwise negative.  ____________________________________________   PHYSICAL EXAM:  VITAL SIGNS: ED Triage Vitals  Enc Vitals Group     BP 07/07/16 1515 103/64     Pulse Rate 07/07/16 1515 72     Resp 07/07/16 1515 18     Temp 07/07/16 1515 98.2 F (36.8 C)     Temp Source 07/07/16 1515 Oral     SpO2 07/07/16 1515 100 %     Weight 07/07/16 1517 220 lb (99.8 kg)     Height 07/07/16 1517 5\' 5"  (1.651 m)     Head Circumference --      Peak Flow --      Pain Score 07/07/16 1517 8     Pain Loc --      Pain Edu? --      Excl. in GC? --      Constitutional: Alert and oriented. Well appearing and in no acute distress. Eyes: Conjunctivae are normal. PERRL. EOMI. Head: Atraumatic. Neck: No stridor.    Cardiovascular: Normal rate, regular rhythm. Normal S1 and S2.  Good peripheral circulation. Respiratory: Normal respiratory effort without tachypnea or retractions. Lungs CTAB. Good air entry to the bases with no decreased or absent breath sounds. Gastrointestinal: Bowel sounds 4 quadrants. Soft and nontender to palpation. No guarding or rigidity. No palpable masses. No distention. No CVA tenderness Genitourinary: No chancres or lesions noted. There is an area of excoriation from scratching. No frank vaginal discharge identified. Positive lift test for fishy odor. Cervix is unremarkable. Bimanual exam reveals no tenderness, no cervical motion tenderness, no abnormalities palpated in adnexa. Musculoskeletal: Full range of motion to all extremities. No gross deformities appreciated. Neurologic:  Normal speech and language. No gross focal neurologic deficits are appreciated.  Skin:  Skin is warm, dry and intact. No rash noted. Psychiatric: Mood and affect are normal. Speech and behavior are normal. Patient exhibits appropriate insight and judgement.  Exam was chaperoned by RN. ____________________________________________   LABS (all labs ordered are listed,  but only abnormal results are displayed)  Labs Reviewed  URINALYSIS, COMPLETE (UACMP) WITH MICROSCOPIC - Abnormal; Notable for the following:       Result Value   Color, Urine YELLOW (*)    APPearance HAZY (*)    Ketones, ur 5 (*)    Leukocytes, UA TRACE (*)    Bacteria, UA FEW (*)    Squamous Epithelial / LPF 6-30 (*)    All other components within normal limits  WET PREP, GENITAL  CHLAMYDIA/NGC RT PCR ([facility] ONLY)  POC URINE PREG, ED  POCT PREGNANCY, URINE   ____________________________________________  EKG   ____________________________________________  RADIOLOGY   No results found.  ____________________________________________    PROCEDURES  Procedure(s) performed:    Procedures    Medications - No data to display   ____________________________________________   INITIAL IMPRESSION / ASSESSMENT AND PLAN / ED COURSE  Pertinent labs & imaging results that were available during my care of the patient were reviewed by me and considered in my medical decision making (see chart for details).  Review  of the [place] CSRS was performed in accordance of the [facility] prior to dispensing any controlled drugs.  Clinical Course     Patient's diagnosis is consistent with BV and his infection. Pelvic exam is consistent with his diagnosis is. Swallowing does not return with clue cells or candidal infection. However, based off of exam I feel that this diagnosis is most consistent with patient's symptoms and presentation. As such, patient will be treated with Diflucan and Flagyl. Patient is to continue using topical nystatin. Patient will follow-up with OB/GYN as necessary. Patient is given ED precautions to return to the ED for any worsening or new symptoms.     ____________________________________________  FINAL CLINICAL IMPRESSION(S) / ED DIAGNOSES  Final diagnoses:  Bacterial vaginosis  Candidal vaginitis      NEW MEDICATIONS STARTED DURING THIS  VISIT:  New Prescriptions   FLUCONAZOLE (DIFLUCAN) 150 MG TABLET    Take 1 tablet (150 mg total) by mouth once a week. Take 1 tablet on day 1; take second tablet a week later after finishing antibiotics   METRONIDAZOLE (FLAGYL) 500 MG TABLET    Take 1 tablet (500 mg total) by mouth 2 (two) times daily.        This chart was dictated using voice recognition software/Dragon. Despite best efforts to proofread, errors can occur which can change the meaning. Any change was purely unintentional.    Racheal PatchesJonathan D Cuthriell, PA-C 07/07/16 1805    Sharman CheekPhillip Stafford, MD 07/08/16 2243

## 2016-07-07 NOTE — ED Triage Notes (Signed)
Vaginal irritation x 1 week. Denies discharge

## 2016-07-09 ENCOUNTER — Telehealth: Payer: Self-pay | Admitting: Emergency Medicine

## 2016-07-09 NOTE — Telephone Encounter (Signed)
Apothecary calling to clarify QTY of Fluconazole , spoke with Dr Pershing ProudSchaevitz and reviewed order, QTY to be 2

## 2016-08-01 ENCOUNTER — Emergency Department: Payer: Medicaid Other

## 2016-08-01 ENCOUNTER — Encounter: Payer: Self-pay | Admitting: Emergency Medicine

## 2016-08-01 ENCOUNTER — Emergency Department
Admission: EM | Admit: 2016-08-01 | Discharge: 2016-08-01 | Disposition: A | Discharge status: Home / Self Care | Payer: Medicaid Other | Attending: Emergency Medicine | Admitting: Emergency Medicine

## 2016-08-01 DIAGNOSIS — R102 Pelvic and perineal pain: Secondary | ICD-10-CM | POA: Insufficient documentation

## 2016-08-01 DIAGNOSIS — R05 Cough: Secondary | ICD-10-CM | POA: Insufficient documentation

## 2016-08-01 DIAGNOSIS — F17210 Nicotine dependence, cigarettes, uncomplicated: Secondary | ICD-10-CM | POA: Insufficient documentation

## 2016-08-01 DIAGNOSIS — Z79899 Other long term (current) drug therapy: Secondary | ICD-10-CM | POA: Insufficient documentation

## 2016-08-01 DIAGNOSIS — N939 Abnormal uterine and vaginal bleeding, unspecified: Secondary | ICD-10-CM

## 2016-08-01 LAB — COMPREHENSIVE METABOLIC PANEL
ALBUMIN: 4.2 g/dL (ref 3.5–5.0)
ALT: 16 U/L (ref 14–54)
AST: 22 U/L (ref 15–41)
Alkaline Phosphatase: 53 U/L (ref 38–126)
Anion gap: 8 (ref 5–15)
BUN: 9 mg/dL (ref 6–20)
CHLORIDE: 104 mmol/L (ref 101–111)
CO2: 26 mmol/L (ref 22–32)
CREATININE: 0.8 mg/dL (ref 0.44–1.00)
Calcium: 9.3 mg/dL (ref 8.9–10.3)
GFR calc non Af Amer: >60 mL/min (ref >60)
GLUCOSE: 82 mg/dL (ref 65–99)
Potassium: 3.4 mmol/L — ABNORMAL LOW (ref 3.5–5.1)
SODIUM: 138 mmol/L (ref 135–145)
Total Bilirubin: 0.6 mg/dL (ref 0.3–1.2)
Total Protein: 7.5 g/dL (ref 6.5–8.1)

## 2016-08-01 LAB — WET PREP, GENITAL
Clue Cells Wet Prep HPF POC: NONE SEEN
SPERM: NONE SEEN
Trich, Wet Prep: NONE SEEN
WBC WET PREP: NONE SEEN
Yeast Wet Prep HPF POC: NONE SEEN

## 2016-08-01 LAB — URINALYSIS, COMPLETE (UACMP) WITH MICROSCOPIC
Bilirubin Urine: NEGATIVE
Glucose, UA: NEGATIVE mg/dL
HGB URINE DIPSTICK: NEGATIVE
Ketones, ur: 5 mg/dL — AB
NITRITE: NEGATIVE
PROTEIN: 30 mg/dL — AB
Specific Gravity, Urine: 1.033 — ABNORMAL HIGH (ref 1.005–1.030)
pH: 5 (ref 5.0–8.0)

## 2016-08-01 LAB — CBC WITH DIFFERENTIAL/PLATELET
BASOS ABS: 0 10*3/uL (ref 0–0.1)
BASOS PCT: 0 %
EOS ABS: 0 10*3/uL (ref 0–0.7)
EOS PCT: 1 %
HCT: 38.6 % (ref 35.0–47.0)
Hemoglobin: 13.1 g/dL (ref 12.0–16.0)
Lymphocytes Relative: 12 %
Lymphs Abs: 0.8 10*3/uL — ABNORMAL LOW (ref 1.0–3.6)
MCH: 29.9 pg (ref 26.0–34.0)
MCHC: 34 g/dL (ref 32.0–36.0)
MCV: 88 fL (ref 80.0–100.0)
Monocytes Absolute: 0.8 10*3/uL (ref 0.2–0.9)
Monocytes Relative: 12 %
Neutro Abs: 5.3 10*3/uL (ref 1.4–6.5)
Neutrophils Relative %: 75 %
PLATELETS: 367 10*3/uL (ref 150–440)
RBC: 4.39 MIL/uL (ref 3.80–5.20)
RDW: 13.4 % (ref 11.5–14.5)
WBC: 7 10*3/uL (ref 3.6–11.0)

## 2016-08-01 LAB — CHLAMYDIA/NGC RT PCR (ARMC ONLY)
Chlamydia Tr: NOT DETECTED
N GONORRHOEAE: NOT DETECTED

## 2016-08-01 LAB — POC URINE PREG, ED: Preg Test, Ur: NEGATIVE

## 2016-08-01 MED ORDER — KETOROLAC TROMETHAMINE 30 MG/ML IJ SOLN
30.0000 mg | Freq: Once | INTRAMUSCULAR | Status: AC
  Administered 2016-08-01: 30 mg via INTRAVENOUS
  Filled 2016-08-01: qty 1

## 2016-08-01 MED ORDER — TRAMADOL HCL 50 MG PO TABS: 50.0000 mg | ORAL_TABLET | Freq: Four times a day (QID) | ORAL | 0 refills | Status: AC | PRN

## 2016-08-01 MED ORDER — DEXTROSE 5 % IV SOLN
250.0000 mg | Freq: Once | INTRAVENOUS | Status: DC
  Filled 2016-08-01: qty 250

## 2016-08-01 MED ORDER — CEPHALEXIN 500 MG PO CAPS: 500.0000 mg | ORAL_CAPSULE | Freq: Two times a day (BID) | ORAL | 0 refills | Status: DC

## 2016-08-01 MED ORDER — ACETAMINOPHEN 325 MG PO TABS
650.0000 mg | ORAL_TABLET | Freq: Once | ORAL | Status: AC
  Administered 2016-08-01: 650 mg via ORAL
  Filled 2016-08-01: qty 2

## 2016-08-01 MED ORDER — SODIUM CHLORIDE 0.9 % IV SOLN
1000.0000 mL | Freq: Once | INTRAVENOUS | Status: AC
  Administered 2016-08-01: 1000 mL via INTRAVENOUS

## 2016-08-01 NOTE — ED Triage Notes (Signed)
Pt ambulatory to triage with difficulty, reports pelvic pain and vag bleeding x 1 day, reports LMP about 12 days ago.  Reports pain radiates to back.  Pt reports hx of ovarian cysts and endometriosis.  Pt reports bleeding not too heavy, just moderate and brown

## 2016-08-01 NOTE — ED Provider Notes (Signed)
[facility] Emergency Department Provider Note   ____________________________________________    I have reviewed the triage vital signs and the nursing notes.   HISTORY  Chief Complaint Pelvic Pain     HPI Abigail [name] is a 26 y.o. female who presents with complaints of pelvic pain. She reports moderate aching sensation in her pelvis which started this morning. She has also had a mild cough since yesterday. She denies vaginal discharge although she has had small amount of vaginal bleeding.No dysuria   Past Medical History:  Diagnosis Date  . Endometriosis   . Ovarian cyst     Patient Active Problem List   Diagnosis Date Noted  . Labor and delivery, indication for care 06/20/2015  . Lumbago 06/18/2015  . Pelvic pain affecting pregnancy 05/28/2015  . Abdominal pain affecting pregnancy 04/12/2015    Past Surgical History:  Procedure Laterality Date  . IUD REMOVAL    . laproscopy w/ attempt to remove L cyst but cyst was no longer present Left 2015  . TUBAL LIGATION  06/21/2015   Procedure: POST PARTUM TUBAL LIGATION;  Surgeon: Elenora Fender Ward, MD;  Location: [facility] ORS;  Service: Gynecology;;    Prior to Admission medications   Medication Sig Start Date End Date Taking? Authorizing Provider  ferrous sulfate 325 (65 FE) MG tablet Take 1 tablet (325 mg total) by mouth 2 (two) times daily with a meal. 06/22/15   Vena Austria, MD  fluconazole (DIFLUCAN) 150 MG tablet Take 1 tablet (150 mg total) by mouth once a week. Take 1 tablet on day 1; take second tablet a week later after finishing antibiotics 07/07/16   Delorise Royals Cuthriell, PA-C  ibuprofen (ADVIL,MOTRIN) 600 MG tablet Take 1 tablet (600 mg total) by mouth every 6 (six) hours. 06/22/15   Vena Austria, MD  metroNIDAZOLE (FLAGYL) 500 MG tablet Take 1 tablet (500 mg total) by mouth 2 (two) times daily. 07/07/16   Delorise Royals Cuthriell, PA-C  ondansetron (ZOFRAN) 4 MG tablet Take 4 mg by  mouth every 8 (eight) hours as needed for nausea or vomiting.    Historical Provider, MD  oxyCODONE-acetaminophen (PERCOCET/ROXICET) 5-325 MG tablet Take 1-2 tablets by mouth every 6 (six) hours as needed. 06/22/15   Vena Austria, MD  Prenatal Multivit-Min-Fe-FA (PRENATAL VITAMINS PO) Take by mouth.    Historical Provider, MD  promethazine (PHENERGAN) 25 MG tablet Take 1 tablet (25 mg total) by mouth every 8 (eight) hours as needed for nausea or vomiting. 12/05/14   Chinita Pester, FNP  sertraline (ZOLOFT) 50 MG tablet Take 1 tablet (50 mg total) by mouth daily. 06/22/15   Vena Austria, MD  traMADol (ULTRAM) 50 MG tablet Take 1 tablet (50 mg total) by mouth every 6 (six) hours as needed. 08/01/16 08/01/17  Jene Every, MD     Allergies Penicillins  Family History  Problem Relation Age of Onset  . Hypertension Mother   . Diabetes Mother   . Cancer Mother     Social History Social History  Substance Use Topics  . Smoking status: Current Every Day Smoker    Packs/day: 0.50    Types: Cigarettes    Last attempt to quit: 02/18/2015  . Smokeless tobacco: Never Used  . Alcohol use No     Comment: occ    Review of Systems  Constitutional: No fever/chills Eyes: No visual changes.  ENT: No sore throat.  Respiratory: Denies shortness of breath.Positive cough Gastrointestinal: No abdominal pain.  No  nausea, no vomiting.   Genitourinary: Negative for dysuria.As above Musculoskeletal: Negative for back pain. Skin: Negative for rash.   10-point ROS otherwise negative.  ____________________________________________   PHYSICAL EXAM:  VITAL SIGNS: ED Triage Vitals [08/01/16 1625]  Enc Vitals Group     BP 133/81     Pulse Rate (!) 103     Resp 20     Temp 100.3 F (37.9 C)     Temp Source Oral     SpO2 100 %     Weight 225 lb (102.1 kg)     Height 5\' 5"  (1.651 m)     Head Circumference      Peak Flow      Pain Score 9     Pain Loc      Pain Edu?      Excl. in GC?       Constitutional: Alert and oriented. No acute distress. Pleasant and interactive Eyes: Conjunctivae are normal.    Cardiovascular: Normal rate, regular rhythm. Grossly normal heart sounds.  Good peripheral circulation. Respiratory: Normal respiratory effort.  No retractions. Lungs CTAB. Gastrointestinal: Soft and nontender. No distention.  No CVA tenderness. Genitourinary: No CMT, no significant discharge Musculoskeletal: .  Warm and well perfused Neurologic:  Normal speech and language. No gross focal neurologic deficits are appreciated.  Skin:  Skin is warm, dry and intact. No rash noted. Psychiatric: Mood and affect are normal. Speech and behavior are normal.  ____________________________________________   LABS (all labs ordered are listed, but only abnormal results are displayed)  Labs Reviewed  CBC WITH DIFFERENTIAL/PLATELET - Abnormal; Notable for the following:       Result Value   Lymphs Abs 0.8 (*)    All other components within normal limits  COMPREHENSIVE METABOLIC PANEL - Abnormal; Notable for the following:    Potassium 3.4 (*)    All other components within normal limits  URINALYSIS, COMPLETE (UACMP) WITH MICROSCOPIC - Abnormal; Notable for the following:    Color, Urine YELLOW (*)    APPearance CLEAR (*)    Specific Gravity, Urine 1.033 (*)    Ketones, ur 5 (*)    Protein, ur 30 (*)    Leukocytes, UA SMALL (*)    Bacteria, UA RARE (*)    Squamous Epithelial / LPF 0-5 (*)    All other components within normal limits  CHLAMYDIA/NGC RT PCR ([facility] ONLY)  WET PREP, GENITAL  POC URINE PREG, ED   ____________________________________________  EKG  None ____________________________________________  RADIOLOGY  Ultrasound pelvis unremarkable CT abdomen pelvis unremarkable ____________________________________________   PROCEDURES  Procedure(s) performed: No    Critical Care performed:  No ____________________________________________   INITIAL IMPRESSION / ASSESSMENT AND PLAN / ED COURSE  Pertinent labs & imaging results that were available during my care of the patient were reviewed by me and considered in my medical decision making (see chart for details).  Patient presented with pelvic pain. Ultrasound is reassuring. Pelvic exam was overall benign with a negative wet prep. Gonorrhea and Chlamydia have also resulted now and they are negative. No CMT. It is unclear what is causing the patient's fever, overall she is well-appearing and nontoxic. Her only complaints besides her pelvic pain is a mild cough. She denies myalgias. She is treated with IV fluids and Toradol  Clinical Course   ----------------------------------------- 10:36 PM on 08/01/2016 -----------------------------------------  Patient feeling somewhat better, vital signs are improved. She is stable for discharge at this time. She is driving herself so we're  unable to give any stronger pain medications but given benign workup feel she was given appropriate treatment. Unclear cause of symptoms, discussed this with patient. Return precautions discussed at length with the patient, she understands the need for outpatient follow-up. She will return if worsening of her symptoms or no improvement. ____________________________________________   FINAL CLINICAL IMPRESSION(S) / ED DIAGNOSES  Final diagnoses:  Pelvic pain in female      NEW MEDICATIONS STARTED DURING THIS VISIT:  New Prescriptions   TRAMADOL (ULTRAM) 50 MG TABLET    Take 1 tablet (50 mg total) by mouth every 6 (six) hours as needed.     Note:  This document was prepared using Dragon voice recognition software and may include unintentional dictation errors.    Jene Everyobert Alexandrea Westergard, MD 08/01/16 2238

## 2016-08-21 DIAGNOSIS — Z1371 Encounter for nonprocreative screening for genetic disease carrier status: Secondary | ICD-10-CM

## 2016-08-21 HISTORY — DX: Encounter for nonprocreative screening for genetic disease carrier status: Z13.71

## 2016-09-16 ENCOUNTER — Telehealth: Payer: Self-pay

## 2016-09-16 NOTE — Telephone Encounter (Signed)
Pt calling to see if lab results are in.

## 2016-10-06 ENCOUNTER — Ambulatory Visit: Payer: Self-pay | Admitting: Obstetrics and Gynecology

## 2016-10-29 ENCOUNTER — Ambulatory Visit (INDEPENDENT_AMBULATORY_CARE_PROVIDER_SITE_OTHER): Payer: BLUE CROSS/BLUE SHIELD | Admitting: Obstetrics and Gynecology

## 2016-10-29 ENCOUNTER — Encounter: Payer: Self-pay | Admitting: Obstetrics and Gynecology

## 2016-10-29 VITALS — BP 124/72 | HR 80 | Ht 65.0 in | Wt 221.0 lb

## 2016-10-29 DIAGNOSIS — N760 Acute vaginitis: Secondary | ICD-10-CM | POA: Diagnosis not present

## 2016-10-29 DIAGNOSIS — L280 Lichen simplex chronicus: Secondary | ICD-10-CM | POA: Diagnosis not present

## 2016-10-29 MED ORDER — METRONIDAZOLE 500 MG PO TABS: 500.0000 mg | ORAL_TABLET | Freq: Two times a day (BID) | ORAL | 0 refills | Status: AC

## 2016-10-29 NOTE — Patient Instructions (Signed)
Bacterial Vaginosis Bacterial vaginosis is a vaginal infection that occurs when the normal balance of bacteria in the vagina is disrupted. It results from an overgrowth of certain bacteria. This is the most common vaginal infection among women ages 15-44. Because bacterial vaginosis increases your risk for STIs (sexually transmitted infections), getting treated can help reduce your risk for chlamydia, gonorrhea, herpes, and HIV (human immunodeficiency virus). Treatment is also important for preventing complications in pregnant women, because this condition can cause an early (premature) delivery. What are the causes? This condition is caused by an increase in harmful bacteria that are normally present in small amounts in the vagina. However, the reason that the condition develops is not fully understood. What increases the risk? The following factors may make you more likely to develop this condition:  Having a new sexual partner or multiple sexual partners.  Having unprotected sex.  Douching.  Having an intrauterine device (IUD).  Smoking.  Drug and alcohol abuse.  Taking certain antibiotic medicines.  Being pregnant.  You cannot get bacterial vaginosis from toilet seats, bedding, swimming pools, or contact with objects around you. What are the signs or symptoms? Symptoms of this condition include:  Grey or white vaginal discharge. The discharge can also be watery or foamy.  A fish-like odor with discharge, especially after sexual intercourse or during menstruation.  Itching in and around the vagina.  Burning or pain with urination.  Some women with bacterial vaginosis have no signs or symptoms. How is this diagnosed? This condition is diagnosed based on:  Your medical history.  A physical exam of the vagina.  Testing a sample of vaginal fluid under a microscope to look for a large amount of bad bacteria or abnormal cells. Your health care provider may use a cotton swab  or a small wooden spatula to collect the sample.  How is this treated? This condition is treated with antibiotics. These may be given as a pill, a vaginal cream, or a medicine that is put into the vagina (suppository). If the condition comes back after treatment, a second round of antibiotics may be needed. Follow these instructions at home: Medicines  Take over-the-counter and prescription medicines only as told by your health care provider.  Take or use your antibiotic as told by your health care provider. Do not stop taking or using the antibiotic even if you start to feel better. General instructions  If you have a female sexual partner, tell her that you have a vaginal infection. She should see her health care provider and be treated if she has symptoms. If you have a female sexual partner, he does not need treatment.  During treatment: ? Avoid sexual activity until you finish treatment. ? Do not douche. ? Avoid alcohol as directed by your health care provider. ? Avoid breastfeeding as directed by your health care provider.  Drink enough water and fluids to keep your urine clear or pale yellow.  Keep the area around your vagina and rectum clean. ? Wash the area daily with warm water. ? Wipe yourself from front to back after using the toilet.  Keep all follow-up visits as told by your health care provider. This is important. How is this prevented?  Do not douche.  Wash the outside of your vagina with warm water only.  Use protection when having sex. This includes latex condoms and dental dams.  Limit how many sexual partners you have. To help prevent bacterial vaginosis, it is best to have sex with just   one partner (monogamous).  Make sure you and your sexual partner are tested for STIs.  Wear cotton or cotton-lined underwear.  Avoid wearing tight pants and pantyhose, especially during summer.  Limit the amount of alcohol that you drink.  Do not use any products that  contain nicotine or tobacco, such as cigarettes and e-cigarettes. If you need help quitting, ask your health care provider.  Do not use illegal drugs. Where to find more information:  Centers for Disease Control and Prevention: www.cdc.gov/std  American Sexual Health Association (ASHA): www.ashastd.org  U.S. Department of Health and Human Services, Office on Women's Health: www.womenshealth.gov/ or https://www.womenshealth.gov/a-z-topics/bacterial-vaginosis Contact a health care provider if:  Your symptoms do not improve, even after treatment.  You have more discharge or pain when urinating.  You have a fever.  You have pain in your abdomen.  You have pain during sex.  You have vaginal bleeding between periods. Summary  Bacterial vaginosis is a vaginal infection that occurs when the normal balance of bacteria in the vagina is disrupted.  Because bacterial vaginosis increases your risk for STIs (sexually transmitted infections), getting treated can help reduce your risk for chlamydia, gonorrhea, herpes, and HIV (human immunodeficiency virus). Treatment is also important for preventing complications in pregnant women, because the condition can cause an early (premature) delivery.  This condition is treated with antibiotic medicines. These may be given as a pill, a vaginal cream, or a medicine that is put into the vagina (suppository). This information is not intended to replace advice given to you by your health care provider. Make sure you discuss any questions you have with your health care provider. Document Released: 07/07/2005 Document Revised: 03/22/2016 Document Reviewed: 03/22/2016 Elsevier Interactive Patient Education  2017 Elsevier Inc.  

## 2016-10-29 NOTE — Progress Notes (Signed)
Obstetrics & Gynecology Office Visit   Chief Complaint:  Chief Complaint  Patient presents with  . medication follwo up    History of Present Illness:[name] is a 26 y.o. [phone] who LMP was Patient's last menstrual period was 10/12/2016., presents today for a problem visit.   Patient complains of an abnormal vaginal discharge for 2 weeks. Discharge described as: watery. Vaginal symptoms include odor.Vulvar symptoms include none.STI Risk: Very low risk of STD exposure.   Other associated symptoms: none.Menstrual pattern: She had been bleeding regularly. Contraception: tubal ligation.  She denies recent antibiotic exposure, no changes in soaps, detergents coinciding with the onset of her symptoms.  She has not previously self treated or been under treatment by another provider for these symptoms.   Was most recently seen by myself a few weeks ago for what clinically appeared to be lichen simplex chronicus, treated with topical steroids.  Symptoms have completely resolved and where mostly in the groin, labia majora but now with vaginal symptoms/  Review of Systems: Review of Systems  Constitutional: Negative for chills and fever.  HENT: Negative for congestion.   Respiratory: Negative for cough and shortness of breath.   Cardiovascular: Negative for chest pain and palpitations.  Gastrointestinal: Negative for abdominal pain, constipation, diarrhea, heartburn, nausea and vomiting.  Genitourinary: Negative for dysuria, frequency and urgency.  Skin: Negative for itching and rash.  Neurological: Negative for dizziness and headaches.  Endo/Heme/Allergies: Negative for polydipsia.  Psychiatric/Behavioral: Negative for depression.    Past Medical History:  Past Medical History:  Diagnosis Date  . BRCA negative 08/2016  . Endometriosis   . Ovarian cyst     Past Surgical History:  Past Surgical History:  Procedure Laterality Date  . IUD REMOVAL    . laproscopy w/  attempt to remove L cyst but cyst was no longer present Left 2015  . TUBAL LIGATION  06/21/2015   Procedure: POST PARTUM TUBAL LIGATION;  Surgeon: Honor Loh Ward, MD;  Location: [facility] ORS;  Service: Gynecology;;    Gynecologic History: Patient's last menstrual period was 10/12/2016.  Obstetric History: [id number]  Family History:  Family History  Problem Relation Age of Onset  . Hypertension Mother   . Diabetes Mother   . Cancer Mother     Ovarian  . Hypertension Maternal Grandmother     Social History:  Social History   Social History  . Marital status: Single    Spouse name: N/A  . Number of children: N/A  . Years of education: N/A   Occupational History  . Not on file.   Social History Main Topics  . Smoking status: Current Every Day Smoker    Packs/day: 0.50    Types: Cigarettes    Last attempt to quit: 02/18/2015  . Smokeless tobacco: Never Used  . Alcohol use No     Comment: occ  . Drug use: No  . Sexual activity: Yes    Birth control/ protection: None   Other Topics Concern  . Not on file   Social History Narrative  . No narrative on file    Allergies:  Allergies  Allergen Reactions  . Penicillins     Reaction unknown.      Medications: Prior to Admission medications   Medication Sig Start Date End Date Taking? Authorizing Provider  diclofenac (VOLTAREN) 75 MG EC tablet Take by mouth. 10/08/16 10/08/17 Yes Historical Provider, MD  ondansetron (ZOFRAN) 4 MG tablet Take 4 mg by mouth every  8 (eight) hours as needed for nausea or vomiting.   Yes Historical Provider, MD  sertraline (ZOLOFT) 50 MG tablet Take 1 tablet (50 mg total) by mouth daily. 06/22/15  Yes Malachy Mood, MD  traMADol (ULTRAM) 50 MG tablet Take 1 tablet (50 mg total) by mouth every 6 (six) hours as needed. 08/01/16 08/01/17 Yes Lavonia Drafts, MD  LYRICA 25 MG capsule  10/09/16   Historical Provider, MD  metroNIDAZOLE (FLAGYL) 500 MG tablet Take 1 tablet (500 mg total) by mouth 2 (two)  times daily. 10/29/16 11/05/16  Malachy Mood, MD    Physical Exam Vitals:  Vitals:   10/29/16 0855  BP: 124/72  Pulse: 80   Patient's last menstrual period was 10/12/2016.  General: NAD HEENT: normocephalic, anicteric Pulmonary: No increased work of breathinga  Genitourinary:  External: Normal external female genitalia.  Normal urethral meatus, normal  Bartholin's and Skene's glands.    Vagina: Normal vaginal mucosa, no evidence of prolapse.    Cervix: Grossly normal in appearance, no bleeding  Uterus: Non-enlarged, mobile, normal contour.  No CMT  Adnexa: ovaries non-enlarged, no adnexal masses  Rectal: deferred  Lymphatic: no evidence of inguinal lymphadenopathy Extremities: no edema, erythema, or tenderness Neurologic: Grossly intact Psychiatric: mood appropriate, affect full  Female chaperone present for pelvic and breast  portions of the physical exam  Wet mount: positive clue cells, no hyphea, no trichomonas, + amine odor  Assessment: 26 y.o. [id number] medication follow up lichen simplex chronicus, now with symptoms of BV  Plan: Problem List Items Addressed This Visit      Genitourinary   Vaginitis and vulvovaginitis   Relevant Orders   NuSwab Vaginitis Plus (VG+) (Completed)    Other Visit Diagnoses    Lichen simplex chronicus    -  Primary      - discussed hormonal treatment option for endometriosis (no significant lesions at time of tubal) is possibly interested in Mirena for long term management - flagyl for BV sx's with nuswab sent - resolution of lichen simplex with clobetasol

## 2016-11-01 LAB — NUSWAB VAGINITIS PLUS (VG+)
Atopobium vaginae: HIGH Score — AB
CHLAMYDIA TRACHOMATIS, NAA: NEGATIVE
Candida albicans, NAA: POSITIVE — AB
Candida glabrata, NAA: NEGATIVE
Neisseria gonorrhoeae, NAA: NEGATIVE
TRICH VAG BY NAA: NEGATIVE

## 2016-11-03 DIAGNOSIS — N760 Acute vaginitis: Secondary | ICD-10-CM | POA: Insufficient documentation

## 2016-11-23 ENCOUNTER — Encounter: Payer: Self-pay | Admitting: Obstetrics and Gynecology

## 2016-11-24 ENCOUNTER — Other Ambulatory Visit: Payer: Self-pay | Admitting: Obstetrics and Gynecology

## 2016-11-24 DIAGNOSIS — N923 Ovulation bleeding: Secondary | ICD-10-CM

## 2016-11-24 NOTE — Telephone Encounter (Signed)
Pt is schedule 12/10/16

## 2016-12-04 ENCOUNTER — Ambulatory Visit (INDEPENDENT_AMBULATORY_CARE_PROVIDER_SITE_OTHER): Payer: BLUE CROSS/BLUE SHIELD

## 2016-12-04 DIAGNOSIS — N923 Ovulation bleeding: Secondary | ICD-10-CM

## 2016-12-10 ENCOUNTER — Encounter: Payer: Self-pay | Admitting: Obstetrics and Gynecology

## 2016-12-10 ENCOUNTER — Other Ambulatory Visit: Payer: BLUE CROSS/BLUE SHIELD

## 2016-12-10 ENCOUNTER — Ambulatory Visit (INDEPENDENT_AMBULATORY_CARE_PROVIDER_SITE_OTHER): Payer: BLUE CROSS/BLUE SHIELD | Admitting: Obstetrics and Gynecology

## 2016-12-10 VITALS — BP 110/70 | HR 59 | Wt 228.0 lb

## 2016-12-10 DIAGNOSIS — N946 Dysmenorrhea, unspecified: Secondary | ICD-10-CM

## 2016-12-10 DIAGNOSIS — N83201 Unspecified ovarian cyst, right side: Secondary | ICD-10-CM

## 2016-12-10 DIAGNOSIS — N8000 Endometriosis of the uterus, unspecified: Secondary | ICD-10-CM

## 2016-12-10 DIAGNOSIS — R102 Pelvic and perineal pain: Secondary | ICD-10-CM

## 2016-12-10 NOTE — Progress Notes (Signed)
Gynecology Ultrasound Follow Up  Chief Complaint:  Chief Complaint  Patient presents with  . GYN U/S follow up     History of Present Illness: Patient is a 26 y.o. female who presents today for ultrasound evaluation of chronic pelvic pain.  Ultrasound demonstrates the following findgins Adnexa: right ovary with two hemorrhagic cysts vs endometriomas, largest measuring 2x3cm Uterus: Globally enlarged without evidence of fibroids endometrial stripe  10.0 mm with indisitinct endometrial myometrial interface consistent with adenomyosis Additional: none  Review of Systems: Review of Systems  Constitutional: Negative for chills and fever.  Gastrointestinal: Positive for abdominal pain. Negative for diarrhea, nausea and vomiting.  Genitourinary: Negative for dysuria and urgency.    Past Medical History:  Past Medical History:  Diagnosis Date  . BRCA negative 08/2016  . Endometriosis   . Ovarian cyst     Past Surgical History:  Past Surgical History:  Procedure Laterality Date  . IUD REMOVAL    . laproscopy w/ attempt to remove L cyst but cyst was no longer present Left 2015  . TUBAL LIGATION  06/21/2015   Procedure: POST PARTUM TUBAL LIGATION;  Surgeon: Honor Loh Ward, MD;  Location: [facility] ORS;  Service: Gynecology;;    Gynecologic History:  Patient's last menstrual period was 12/04/2016 (approximate). Contraception: tubal ligation Last Pap: 11/13/14. Results were: .no abnormalities  Family History:  Family History  Problem Relation Age of Onset  . Hypertension Mother   . Diabetes Mother   . Cancer Mother        Ovarian  . Hypertension Maternal Grandmother     Social History:  Social History   Social History  . Marital status: Single    Spouse name: N/A  . Number of children: N/A  . Years of education: N/A   Occupational History  . Not on file.   Social History Main Topics  . Smoking status: Current Every Day Smoker    Packs/day: 0.50    Types:  Cigarettes    Last attempt to quit: 02/18/2015  . Smokeless tobacco: Never Used  . Alcohol use No     Comment: occ  . Drug use: No  . Sexual activity: Yes    Birth control/ protection: None   Other Topics Concern  . Not on file   Social History Narrative  . No narrative on file    Allergies:  Allergies  Allergen Reactions  . Penicillins     Reaction unknown.      Medications: Prior to Admission medications   Medication Sig Start Date End Date Taking? Authorizing Provider  clonazePAM (KLONOPIN) 0.5 MG tablet Take 0.5 mg by mouth 2 (two) times daily as needed for anxiety.   Yes [provider]  diclofenac (VOLTAREN) 75 MG EC tablet Take by mouth. 10/08/16 10/08/17 Yes [provider]  LYRICA 25 MG capsule  10/09/16  Yes [provider]  ondansetron (ZOFRAN) 4 MG tablet Take 4 mg by mouth every 8 (eight) hours as needed for nausea or vomiting.   Yes [provider]  sertraline (ZOLOFT) 50 MG tablet Take 1 tablet (50 mg total) by mouth daily. 06/22/15  Yes Malachy Mood, MD  traMADol (ULTRAM) 50 MG tablet Take 1 tablet (50 mg total) by mouth every 6 (six) hours as needed. 08/01/16 08/01/17 Yes Lavonia Drafts, MD    Physical Exam Vitals: Blood pressure 110/70, pulse (!) 59, weight 228 lb (103.4 kg), last menstrual period 12/04/2016, unknown if currently breastfeeding.  General: NAD HEENT: normocephalic,  anicteric Pulmonary: No increased work of breathing Extremities: no edema, erythema, or tenderness Neurologic: Grossly intact, normal gait Psychiatric: mood appropriate, affect full   Assessment: 26 y.o. [id number] No problem-specific Assessment & Plan notes found for this encounter.   Plan: Problem List Items Addressed This Visit    None    Visit Diagnoses    Uterus, adenomyosis    -  Primary   Right ovarian cyst       Female pelvic pain       Dysmenorrhea          1) We discussed endometriosis remains in differential.  We discussed  options including Mirena (previously tried), norethindrone, depo provera, diagnostic laparoscopy (no evidence of endometriosis at time of tubal), or hysterectomy.  We discussed the evidence of hysterectomy in central pelvic pain.  If the pain is not uterine in origin she may note no improvement or actual worsening in symptoms postoperatively.  She will contemplate her options and contact the office to let us know what her final treatment decision is.

## 2016-12-29 ENCOUNTER — Emergency Department
Admission: EM | Admit: 2016-12-29 | Discharge: 2016-12-29 | Disposition: A | Discharge status: Home / Self Care | Payer: BLUE CROSS/BLUE SHIELD | Attending: Emergency Medicine | Admitting: Emergency Medicine

## 2016-12-29 ENCOUNTER — Encounter: Payer: Self-pay | Admitting: Emergency Medicine

## 2016-12-29 DIAGNOSIS — N898 Other specified noninflammatory disorders of vagina: Secondary | ICD-10-CM | POA: Diagnosis not present

## 2016-12-29 DIAGNOSIS — Z79899 Other long term (current) drug therapy: Secondary | ICD-10-CM | POA: Diagnosis not present

## 2016-12-29 DIAGNOSIS — J069 Acute upper respiratory infection, unspecified: Secondary | ICD-10-CM

## 2016-12-29 DIAGNOSIS — J399 Disease of upper respiratory tract, unspecified: Secondary | ICD-10-CM | POA: Insufficient documentation

## 2016-12-29 DIAGNOSIS — R05 Cough: Secondary | ICD-10-CM | POA: Diagnosis present

## 2016-12-29 DIAGNOSIS — F17210 Nicotine dependence, cigarettes, uncomplicated: Secondary | ICD-10-CM | POA: Insufficient documentation

## 2016-12-29 LAB — URINALYSIS, ROUTINE W REFLEX MICROSCOPIC
Bilirubin Urine: NEGATIVE
Glucose, UA: NEGATIVE mg/dL
Hgb urine dipstick: NEGATIVE
Ketones, ur: NEGATIVE mg/dL
Leukocytes, UA: NEGATIVE
Nitrite: NEGATIVE
Protein, ur: NEGATIVE mg/dL
Specific Gravity, Urine: 1.021 (ref 1.005–1.030)
pH: 7 (ref 5.0–8.0)

## 2016-12-29 LAB — WET PREP, GENITAL
Clue Cells Wet Prep HPF POC: NONE SEEN
Sperm: NONE SEEN
Trich, Wet Prep: NONE SEEN
Yeast Wet Prep HPF POC: NONE SEEN

## 2016-12-29 LAB — CHLAMYDIA/NGC RT PCR (ARMC ONLY)
Chlamydia Tr: NOT DETECTED
N gonorrhoeae: NOT DETECTED

## 2016-12-29 LAB — POCT PREGNANCY, URINE: Preg Test, Ur: NEGATIVE

## 2016-12-29 MED ORDER — PROMETHAZINE-CODEINE 6.25-10 MG/5ML PO SYRP: 5.0000 mL | ORAL_SOLUTION | Freq: Four times a day (QID) | ORAL | 0 refills | Status: DC | PRN

## 2016-12-29 NOTE — ED Provider Notes (Signed)
[facility] Emergency Department Provider Note  ____________________________________________  Time seen: Approximately 4:36 PM  I have reviewed the triage vital signs and the nursing notes.   HISTORY  Chief Complaint Cough   HPI Abigail [name] is a 26 y.o. female who presents to the emergency department for evaluation of productive cough x 5 days that is worse when lying down. No relief with OTC cold and cough medications. Chest pain with cough. She also states that she has had some vaginal irritation that started 3 days ago. She reports unprotected intercourse with more than one partner.  No relief with Monostat. Nausea started yesterday without vomiting. She denies fever or headache.    Past Medical History:  Diagnosis Date  . BRCA negative 08/2016  . Endometriosis   . Ovarian cyst     Patient Active Problem List   Diagnosis Date Noted  . Vaginitis and vulvovaginitis 11/03/2016  . Labor and delivery, indication for care 06/20/2015  . Lumbago 06/18/2015  . Pelvic pain affecting pregnancy 05/28/2015  . Abdominal pain affecting pregnancy 04/12/2015    Past Surgical History:  Procedure Laterality Date  . IUD REMOVAL    . laproscopy w/ attempt to remove L cyst but cyst was no longer present Left 2015  . TUBAL LIGATION  06/21/2015   Procedure: POST PARTUM TUBAL LIGATION;  Surgeon: Honor Loh Ward, MD;  Location: [facility] ORS;  Service: Gynecology;;    Prior to Admission medications   Medication Sig Start Date End Date Taking? Authorizing Provider  clonazePAM (KLONOPIN) 0.5 MG tablet Take 0.5 mg by mouth 2 (two) times daily as needed for anxiety.    [provider]  diclofenac (VOLTAREN) 75 MG EC tablet Take by mouth. 10/08/16 10/08/17  [provider]  LYRICA 25 MG capsule  10/09/16   [provider]  ondansetron (ZOFRAN) 4 MG tablet Take 4 mg by mouth every 8 (eight) hours as needed for nausea or vomiting.    [provider]  promethazine-codeine (PHENERGAN WITH CODEINE) 6.25-10 MG/5ML syrup Take 5 mLs by mouth every 6 (six) hours as needed for cough. 12/29/16   Harutyun Monteverde B, FNP  sertraline (ZOLOFT) 50 MG tablet Take 1 tablet (50 mg total) by mouth daily. 06/22/15   Malachy Mood, MD  traMADol (ULTRAM) 50 MG tablet Take 1 tablet (50 mg total) by mouth every 6 (six) hours as needed. 08/01/16 08/01/17  Lavonia Drafts, MD    Allergies Penicillins  Family History  Problem Relation Age of Onset  . Hypertension Mother   . Diabetes Mother   . Cancer Mother        Ovarian  . Hypertension Maternal Grandmother     Social History Social History  Substance Use Topics  . Smoking status: Current Every Day Smoker    Packs/day: 0.50    Types: Cigarettes    Last attempt to quit: 02/18/2015  . Smokeless tobacco: Never Used  . Alcohol use No     Comment: occ    Review of Systems Constitutional: Negative for fever/chills ENT: Negative sore throat. Cardiovascular: Denies chest pain. Respiratory: Negative shortness of breath. Positive for cough. Gastrointestinal: Positive for nausea,  Negative for vomiting. Negative for  diarrhea.  Musculoskeletal: Negative for body aches Skin: Negative for rash. Neurological: Negative for headaches ____________________________________________   PHYSICAL EXAM:  VITAL SIGNS: ED Triage Vitals  Enc Vitals Group     BP 12/29/16 1616 (!) 130/59     Pulse Rate 12/29/16 1616 85  Resp 12/29/16 1616 18     Temp 12/29/16 1616 98.7 F (37.1 C)     Temp Source 12/29/16 1616 Oral     SpO2 12/29/16 1616 100 %     Weight --      Height --      Head Circumference --      Peak Flow --      Pain Score 12/29/16 1618 8     Pain Loc --      Pain Edu? --      Excl. in [place]? --     Constitutional: Alert and oriented. Well appearing and in no acute distress. Eyes: Conjunctivae are normal. EOMI. Ears: Right TM injected without loss of light reflex. Left TM normal.  Nose: No  congestion noted; no rhinnorhea. Mouth/Throat: Mucous membranes are moist.  Oropharynx normal. Tonsils not visualized. Neck: No stridor.  Lymphatic: No cervical lymphadenopathy. Cardiovascular: Normal rate, regular rhythm. Good peripheral circulation. Respiratory: Normal respiratory effort.  No retractions. Breath sounds are clear to auscultation.. Gastrointestinal: Soft and nontender.  Genitourinary: No discharge noted in the vaginal vault. Clear, mucous like substance on the cervix. Superficial ulceration at about 6 o'clock on the cervix that is non tender to touch. Mild cervical motion tenderness with increase in adnexal tenderness on the right side on bimanual exam.  Musculoskeletal: FROM x 4 extremities.  Neurologic:  Normal speech and language.  Skin:  Skin is warm, dry and intact. No rash noted. Psychiatric: Mood and affect are normal. Speech and behavior are normal.  ____________________________________________   LABS (all labs ordered are listed, but only abnormal results are displayed)  Labs Reviewed  WET PREP, GENITAL - Abnormal; Notable for the following:       Result Value   WBC, Wet Prep HPF POC FEW (*)    All other components within normal limits  URINALYSIS, ROUTINE W REFLEX MICROSCOPIC - Abnormal; Notable for the following:    Color, Urine YELLOW (*)    APPearance HAZY (*)    All other components within normal limits  CHLAMYDIA/NGC RT PCR ([facility] ONLY)  POC URINE PREG, ED  POCT PREGNANCY, URINE   ____________________________________________  EKG  Not indicated. ____________________________________________  RADIOLOGY  Not indicated. ____________________________________________   PROCEDURES  Procedure(s) performed: None  Critical Care performed: No ____________________________________________   INITIAL IMPRESSION / ASSESSMENT AND PLAN / ED COURSE  26 year old female presenting to the emergency department due to multiple medical complaints.  Chart review reveals recent exam by gynecology and documentation indicates ovarian cysts on the right, which would explain the adnexal tenderness on exam. No sign of candida, bacteria vaginosis, or trichomoniasis on wet prep results. She will be advised to follow up with [facility] for further workup if symptoms do not resolve over the next few days. She will be treated with phenergan and codeine syrup for cough and advised to follow up with her PCP for symptoms that are not improving over the next few days or return to the ER for symptoms that change or worsen if unable to schedule an appointment.  Pertinent labs & imaging results that were available during my care of the patient were reviewed by me and considered in my medical decision making (see chart for details).  Discharge Medication List as of 12/29/2016  6:44 PM    START taking these medications   Details  promethazine-codeine (PHENERGAN WITH CODEINE) 6.25-10 MG/5ML syrup Take 5 mLs by mouth every 6 (six) hours as needed for cough., Starting Mon  12/29/2016, Print        If controlled substance prescribed during this visit, 12 month history viewed on the [facility] prior to issuing an initial prescription for Schedule II or III opiod. ____________________________________________   FINAL CLINICAL IMPRESSION(S) / ED DIAGNOSES  Final diagnoses:  Vaginal irritation  Upper respiratory tract infection, unspecified type    Note:  This document was prepared using Dragon voice recognition software and may include unintentional dictation errors.     Victorino Dike, FNP 12/29/16 1943    Orbie Pyo, MD 12/29/16 2111

## 2016-12-29 NOTE — ED Triage Notes (Signed)
Cough with chest wall soreness x 5 days.

## 2016-12-29 NOTE — ED Notes (Signed)
See triage note  States she developed a cough about 5 days ago   Having some discomfort in chest with cough and deep breathing  Denied any fever  Also is having some vaginal itching and nausea

## 2017-01-05 ENCOUNTER — Encounter: Payer: Self-pay | Admitting: Obstetrics and Gynecology

## 2017-01-14 ENCOUNTER — Telehealth: Payer: Self-pay | Admitting: Obstetrics and Gynecology

## 2017-01-14 NOTE — Telephone Encounter (Signed)
Patient is aware of H&P on 03/13/17 @ 4:30pm with Dr Bonney AidStaebler, Pre-admit phone interview, and OR on 03/26/17. Patient was given my ext.

## 2017-01-14 NOTE — Telephone Encounter (Signed)
-----   Message from Vena AustriaAndreas Staebler, MD sent at 01/13/2017  3:26 PM EDT ----- Surgery Date: Sometime in August  LOS: inpatient  Surgery Booking Request Patient Full Name: Abigail [name], Abigail [name] MRN: [id number]  DOB: November 12, 1990  Surgeon: Vena AustriaAndreas Staebler, MD  Requested Surgery Date and Time: Sometime in August Primary Diagnosis and Code: Menorrhagia Secondary Diagnosis and Code: Pelvic pain Surgical Procedure: TLH/BS and cystoscopy L&D Notification:N/A Admission Status: observation Length of Surgery: 2hrs Special Case Needs: none H&P:  (date) Phone Interview or Office Pre-Admit: either Interpreter: none Language: English Medical Clearance: none Special Scheduling Instructions: none

## 2017-02-23 ENCOUNTER — Encounter: Payer: Self-pay | Admitting: Obstetrics and Gynecology

## 2017-02-23 ENCOUNTER — Other Ambulatory Visit: Payer: Self-pay | Admitting: Obstetrics and Gynecology

## 2017-02-23 MED ORDER — CLOBETASOL PROPIONATE 0.05 % EX CREA: 1.0000 "application " | TOPICAL_CREAM | Freq: Two times a day (BID) | CUTANEOUS | 1 refills | Status: DC

## 2017-03-13 ENCOUNTER — Encounter: Payer: BLUE CROSS/BLUE SHIELD | Admitting: Obstetrics and Gynecology

## 2017-03-16 ENCOUNTER — Encounter: Payer: Self-pay | Admitting: Emergency Medicine

## 2017-03-16 ENCOUNTER — Emergency Department
Admission: EM | Admit: 2017-03-16 | Discharge: 2017-03-16 | Disposition: A | Discharge status: Home / Self Care | Payer: BLUE CROSS/BLUE SHIELD | Attending: Emergency Medicine | Admitting: Emergency Medicine

## 2017-03-16 ENCOUNTER — Emergency Department: Payer: BLUE CROSS/BLUE SHIELD

## 2017-03-16 DIAGNOSIS — R079 Chest pain, unspecified: Secondary | ICD-10-CM | POA: Diagnosis present

## 2017-03-16 LAB — CBC
HEMATOCRIT: 40.2 % (ref 35.0–47.0)
HEMOGLOBIN: 13.4 g/dL (ref 12.0–16.0)
MCH: 29.9 pg (ref 26.0–34.0)
MCHC: 33.4 g/dL (ref 32.0–36.0)
MCV: 89.6 fL (ref 80.0–100.0)
Platelets: 419 10*3/uL (ref 150–440)
RBC: 4.48 MIL/uL (ref 3.80–5.20)
RDW: 13.7 % (ref 11.5–14.5)
WBC: 11.5 10*3/uL — ABNORMAL HIGH (ref 3.6–11.0)

## 2017-03-16 LAB — BASIC METABOLIC PANEL
ANION GAP: 7 (ref 5–15)
BUN: 13 mg/dL (ref 6–20)
CALCIUM: 9.1 mg/dL (ref 8.9–10.3)
CO2: 24 mmol/L (ref 22–32)
Chloride: 107 mmol/L (ref 101–111)
Creatinine, Ser: 0.89 mg/dL (ref 0.44–1.00)
GFR calc Af Amer: >60 mL/min (ref >60)
GLUCOSE: 95 mg/dL (ref 65–99)
POTASSIUM: 3.7 mmol/L (ref 3.5–5.1)
Sodium: 138 mmol/L (ref 135–145)

## 2017-03-16 LAB — TROPONIN I: Troponin I: <0.03 ng/mL (ref <0.03)

## 2017-03-16 NOTE — ED Triage Notes (Signed)
Pt in via POV with complaints of left sided chest pain x 3 days, denies any accompanying symptoms.  Pt also reports intermittent vaginal bleeding and worsening abdominal cramping x approximately 3 weeks.  Pt with hx of endometriosis and ovarian cysts.  NAD noted at this time.

## 2017-03-19 ENCOUNTER — Other Ambulatory Visit: Payer: BLUE CROSS/BLUE SHIELD

## 2017-03-26 ENCOUNTER — Ambulatory Visit
Admission: RE | Admit: 2017-03-26 | Payer: BLUE CROSS/BLUE SHIELD | Source: Ambulatory Visit | Admitting: Obstetrics and Gynecology

## 2017-03-26 ENCOUNTER — Encounter: Admission: RE | Payer: Self-pay | Source: Ambulatory Visit

## 2017-03-26 SURGERY — HYSTERECTOMY, TOTAL, LAPAROSCOPIC
Anesthesia: Choice

## 2017-03-30 ENCOUNTER — Encounter: Payer: Self-pay | Admitting: Obstetrics and Gynecology

## 2017-04-29 ENCOUNTER — Encounter: Payer: Self-pay | Admitting: Emergency Medicine

## 2017-04-29 DIAGNOSIS — R51 Headache: Secondary | ICD-10-CM | POA: Insufficient documentation

## 2017-04-29 DIAGNOSIS — F17210 Nicotine dependence, cigarettes, uncomplicated: Secondary | ICD-10-CM | POA: Insufficient documentation

## 2017-04-29 DIAGNOSIS — Z79899 Other long term (current) drug therapy: Secondary | ICD-10-CM | POA: Insufficient documentation

## 2017-04-29 DIAGNOSIS — R110 Nausea: Secondary | ICD-10-CM | POA: Diagnosis not present

## 2017-04-29 NOTE — ED Triage Notes (Signed)
Patient ambulatory to triage with steady gait, without difficulty or distress noted; pt reports frontal HA x 5 days with no accomp symptoms

## 2017-04-30 ENCOUNTER — Emergency Department
Admission: EM | Admit: 2017-04-30 | Discharge: 2017-04-30 | Disposition: A | Discharge status: Home / Self Care | Payer: BLUE CROSS/BLUE SHIELD | Attending: Emergency Medicine | Admitting: Emergency Medicine

## 2017-04-30 DIAGNOSIS — R51 Headache: Secondary | ICD-10-CM

## 2017-04-30 DIAGNOSIS — R519 Headache, unspecified: Secondary | ICD-10-CM

## 2017-04-30 LAB — BASIC METABOLIC PANEL
ANION GAP: 9 (ref 5–15)
BUN: 11 mg/dL (ref 6–20)
CALCIUM: 8.7 mg/dL — AB (ref 8.9–10.3)
CO2: 24 mmol/L (ref 22–32)
CREATININE: 0.76 mg/dL (ref 0.44–1.00)
Chloride: 107 mmol/L (ref 101–111)
GFR calc Af Amer: >60 mL/min (ref >60)
GFR calc non Af Amer: >60 mL/min (ref >60)
GLUCOSE: 106 mg/dL — AB (ref 65–99)
Potassium: 3.4 mmol/L — ABNORMAL LOW (ref 3.5–5.1)
Sodium: 140 mmol/L (ref 135–145)

## 2017-04-30 LAB — CBC
HEMATOCRIT: 35.2 % (ref 35.0–47.0)
Hemoglobin: 12.2 g/dL (ref 12.0–16.0)
MCH: 30 pg (ref 26.0–34.0)
MCHC: 34.5 g/dL (ref 32.0–36.0)
MCV: 87 fL (ref 80.0–100.0)
PLATELETS: 363 10*3/uL (ref 150–440)
RBC: 4.05 MIL/uL (ref 3.80–5.20)
RDW: 13.5 % (ref 11.5–14.5)
WBC: 11.4 10*3/uL — ABNORMAL HIGH (ref 3.6–11.0)

## 2017-04-30 MED ORDER — SODIUM CHLORIDE 0.9 % IV BOLUS (SEPSIS)
1000.0000 mL | Freq: Once | INTRAVENOUS | Status: AC
  Administered 2017-04-30: 1000 mL via INTRAVENOUS

## 2017-04-30 MED ORDER — KETOROLAC TROMETHAMINE 30 MG/ML IJ SOLN
30.0000 mg | Freq: Once | INTRAMUSCULAR | Status: AC
  Administered 2017-04-30: 30 mg via INTRAVENOUS
  Filled 2017-04-30: qty 1

## 2017-04-30 MED ORDER — METOCLOPRAMIDE HCL 5 MG/ML IJ SOLN
10.0000 mg | Freq: Once | INTRAMUSCULAR | Status: AC
  Administered 2017-04-30: 10 mg via INTRAVENOUS
  Filled 2017-04-30: qty 2

## 2017-04-30 MED ORDER — DIPHENHYDRAMINE HCL 50 MG/ML IJ SOLN
25.0000 mg | Freq: Once | INTRAMUSCULAR | Status: AC
  Administered 2017-04-30: 25 mg via INTRAVENOUS
  Filled 2017-04-30: qty 1

## 2017-04-30 MED ORDER — BUTALBITAL-APAP-CAFFEINE 50-325-40 MG PO TABS: 1.0000 | ORAL_TABLET | Freq: Four times a day (QID) | ORAL | 0 refills | Status: AC | PRN

## 2017-04-30 NOTE — Discharge Instructions (Signed)
Please follow up with neurology for further evaluation of your headache

## 2017-04-30 NOTE — ED Notes (Signed)
No answer when called several times from lobby 

## 2017-04-30 NOTE — ED Provider Notes (Signed)
[facility] Emergency Department Provider Note   ____________________________________________   First MD Initiated Contact with Patient 04/30/17 [phone]     (approximate)  I have reviewed the triage vital signs and the nursing notes.   HISTORY  Chief Complaint Headache    HPI Abigail [name] is a 26 y.o. female who comes into the hospital today with a headache. She states that she's had this pain for the last 5 days. She states that she's taken Tylenol Excedrin and ibuprofen but the headache just won't go away. It just stays in lingers and is very aggravating. The patient states that she's never had a headache like this in the past. She rates her pain currently an 8 out of 10 in intensity. She states that the front of her head and she aches all over her head. It is not a pressure pain which she did start having some shooting pains down the back of her neck. The patient has had what she considers migraines in the past where she sensitive to light and sounds but she states that this is not the same. The patient has had some nausea and states that she vomited once about a day or 2 ago. The patient was concerned so she decided to come into the hospital today for further evaluation.   Past Medical History:  Diagnosis Date  . BRCA negative 08/2016  . Endometriosis   . Ovarian cyst     Patient Active Problem List   Diagnosis Date Noted  . Vaginitis and vulvovaginitis 11/03/2016  . Labor and delivery, indication for care 06/20/2015  . Lumbago 06/18/2015  . Pelvic pain affecting pregnancy 05/28/2015  . Abdominal pain affecting pregnancy 04/12/2015    Past Surgical History:  Procedure Laterality Date  . IUD REMOVAL    . laproscopy w/ attempt to remove L cyst but cyst was no longer present Left 2015  . TUBAL LIGATION  06/21/2015   Procedure: POST PARTUM TUBAL LIGATION;  Surgeon: Honor Loh Ward, MD;  Location: [facility] ORS;  Service: Gynecology;;    Prior to  Admission medications   Medication Sig Start Date End Date Taking? Authorizing Provider  butalbital-acetaminophen-caffeine (FIORICET, ESGIC) 50-325-40 MG tablet Take 1-2 tablets by mouth every 6 (six) hours as needed for headache. 04/30/17 04/30/18  Loney Hering, MD  clobetasol cream (TEMOVATE) 6.27 % Apply 1 application topically 2 (two) times daily. 02/23/17   Malachy Mood, MD  clonazePAM (KLONOPIN) 0.5 MG tablet Take 0.5 mg by mouth 2 (two) times daily as needed for anxiety.    [provider]  diclofenac (VOLTAREN) 75 MG EC tablet Take by mouth. 10/08/16 10/08/17  [provider]  LYRICA 25 MG capsule  10/09/16   [provider]  ondansetron (ZOFRAN) 4 MG tablet Take 4 mg by mouth every 8 (eight) hours as needed for nausea or vomiting.    [provider]  promethazine-codeine (PHENERGAN WITH CODEINE) 6.25-10 MG/5ML syrup Take 5 mLs by mouth every 6 (six) hours as needed for cough. 12/29/16   Triplett, Cari B, FNP  sertraline (ZOLOFT) 50 MG tablet Take 1 tablet (50 mg total) by mouth daily. 06/22/15   Malachy Mood, MD  traMADol (ULTRAM) 50 MG tablet Take 1 tablet (50 mg total) by mouth every 6 (six) hours as needed. 08/01/16 08/01/17  Lavonia Drafts, MD    Allergies Penicillins  Family History  Problem Relation Age of Onset  . Hypertension Mother   . Diabetes Mother   . Cancer  Mother        Ovarian  . Hypertension Maternal Grandmother     Social History Social History  Substance Use Topics  . Smoking status: Current Every Day Smoker    Packs/day: 0.50    Types: Cigarettes    Last attempt to quit: 02/18/2015  . Smokeless tobacco: Never Used  . Alcohol use No     Comment: occ    Review of Systems  Constitutional: No fever/chills Eyes: No visual changes. ENT: No sore throat. Cardiovascular: Denies chest pain. Respiratory: Denies shortness of breath. Gastrointestinal: No abdominal pain.  No nausea, no vomiting.  No diarrhea.  No  constipation. Genitourinary: Negative for dysuria. Musculoskeletal: Negative for back pain. Skin: Negative for rash. Neurological: Headache   ____________________________________________   PHYSICAL EXAM:  VITAL SIGNS: ED Triage Vitals  Enc Vitals Group     BP 04/29/17 2320 (!) 142/99     Pulse Rate 04/29/17 2320 89     Resp --      Temp --      Temp src --      SpO2 04/29/17 2320 100 %     Weight 04/29/17 2319 225 lb (102.1 kg)     Height 04/29/17 2319 5' 5"  (1.651 m)     Head Circumference --      Peak Flow --      Pain Score --      Pain Loc --      Pain Edu? --      Excl. in [place]? --     Constitutional: Alert and oriented. Well appearing and in moderate distress. Eyes: Conjunctivae are normal. PERRL. EOMI. Head: Atraumatic. Nose: No congestion/rhinnorhea. Mouth/Throat: Mucous membranes are moist.  Oropharynx non-erythematous. Neck: Supple without any signs of meningismus Cardiovascular: Normal rate, regular rhythm. Grossly normal heart sounds.  Good peripheral circulation. Respiratory: Normal respiratory effort.  No retractions. Lungs CTAB. Gastrointestinal: Soft and nontender. No distention.  Musculoskeletal: No lower extremity tenderness nor edema.  Neurologic:  Normal speech and language. Cranial nerves II through XII are grossly intact with no focal motor or neuro deficits. Skin:  Skin is warm, dry and intact.  Psychiatric: Mood and affect are normal.   ____________________________________________   LABS (all labs ordered are listed, but only abnormal results are displayed)  Labs Reviewed  CBC - Abnormal; Notable for the following:       Result Value   WBC 11.4 (*)    All other components within normal limits  BASIC METABOLIC PANEL - Abnormal; Notable for the following:    Potassium 3.4 (*)    Glucose, Bld 106 (*)    Calcium 8.7 (*)    All other components within normal limits    ____________________________________________  EKG  None ____________________________________________  RADIOLOGY  No results found.  ____________________________________________   PROCEDURES  Procedure(s) performed: None  Procedures  Critical Care performed: No  ____________________________________________   INITIAL IMPRESSION / ASSESSMENT AND PLAN / ED COURSE  As part of my medical decision making, I reviewed the following data within the electronic MEDICAL RECORD NUMBER Notes from prior ED visits   This is a 26 year old female who comes into the hospital today with a headache. The patient had this headache for the past 5 days.  Her differential diagnosis includes atypical migraine, tension headache, dehydration, intracranial pathology.  I will check some blood work and give the patient a dose of Reglan, Benadryl, Toradol and a liter of normal saline. I will reassess the patient once I  received the results of her blood work.     The patient's blood work is unremarkable. I did give her some medication and she slept for some time. When she woke up she reports that her headache is gone and she feels much better. I did discuss with the patient her previous headaches and the possibility that this might of been a a migraine causing these symptoms. I will have the patient follow-up with neurology for further evaluation of her symptoms. She should return with any worsening condition. ____________________________________________   FINAL CLINICAL IMPRESSION(S) / ED DIAGNOSES  Final diagnoses:  Acute nonintractable headache, unspecified headache type      NEW MEDICATIONS STARTED DURING THIS VISIT:  New Prescriptions   BUTALBITAL-ACETAMINOPHEN-CAFFEINE (FIORICET, ESGIC) 50-325-40 MG TABLET    Take 1-2 tablets by mouth every 6 (six) hours as needed for headache.     Note:  This document was prepared using Dragon voice recognition software and may include unintentional  dictation errors.    Loney Hering, MD 04/30/17 [id number]

## 2017-05-05 ENCOUNTER — Ambulatory Visit (INDEPENDENT_AMBULATORY_CARE_PROVIDER_SITE_OTHER): Payer: BLUE CROSS/BLUE SHIELD | Admitting: Obstetrics and Gynecology

## 2017-05-05 ENCOUNTER — Encounter: Payer: Self-pay | Admitting: Obstetrics and Gynecology

## 2017-05-05 VITALS — BP 128/80 | HR 91 | Ht 65.0 in | Wt 236.0 lb

## 2017-05-05 DIAGNOSIS — Z113 Encounter for screening for infections with a predominantly sexual mode of transmission: Secondary | ICD-10-CM

## 2017-05-05 NOTE — Progress Notes (Signed)
Gynecology STD Evaluation   hChief Complaint:  Chief Complaint  Patient presents with  . Gynecologic Exam    STD check/discharge w/odor    History of Present Illness: Patient is a 26 y.o. [id number] presents for STD testing.  The patient has not noted intermenstrual spotting,  has not experienced postcoital bleeding, and does not report increased vaginal discharge.  There is not a history of prior sexually transmitted infection(s).     The patient is sexually active and reports 2 current sexual partners . She recently had unprotected intercourse with a new partner. She sometimes uses condoms or barrier methods to prevent the spread of sexually transmitted infections.  The patient has not been previously transfused or tattooed.    Review of Systems: 10 point review of systems negative unless otherwise noted in HPI  Past Medical History:  Past Medical History:  Diagnosis Date  . BRCA negative 08/2016  . Endometriosis   . Ovarian cyst     Past Surgical History:  Past Surgical History:  Procedure Laterality Date  . IUD REMOVAL    . laproscopy w/ attempt to remove L cyst but cyst was no longer present Left 2015  . TUBAL LIGATION  06/21/2015   Procedure: POST PARTUM TUBAL LIGATION;  Surgeon: Honor Loh Ward, MD;  Location: [facility] ORS;  Service: Gynecology;;    Gynecologic History: Patient's last menstrual period was 04/23/2017 (exact date).  Obstetric History: [id number]  Family History:  Family History  Problem Relation Age of Onset  . Hypertension Mother   . Diabetes Mother   . Cancer Mother        Ovarian  . Hypertension Maternal Grandmother     Social History:  Social History   Social History  . Marital status: Single    Spouse name: N/A  . Number of children: N/A  . Years of education: N/A   Occupational History  . Not on file.   Social History Main Topics  . Smoking status: Current Every Day Smoker    Packs/day: 0.50    Types: Cigarettes    Last attempt to  quit: 02/18/2015  . Smokeless tobacco: Never Used  . Alcohol use No     Comment: occ  . Drug use: No  . Sexual activity: Yes    Birth control/ protection: None   Other Topics Concern  . Not on file   Social History Narrative  . No narrative on file    Allergies:  Allergies  Allergen Reactions  . Penicillins     Reaction unknown.      Medications: Prior to Admission medications   Medication Sig Start Date End Date Taking? Authorizing Provider  butalbital-acetaminophen-caffeine (FIORICET, ESGIC) 50-325-40 MG tablet Take 1-2 tablets by mouth every 6 (six) hours as needed for headache. 04/30/17 04/30/18 Yes Loney Hering, MD  clobetasol cream (TEMOVATE) 7.91 % Apply 1 application topically 2 (two) times daily. 02/23/17  Yes Malachy Mood, MD  clonazePAM (KLONOPIN) 0.5 MG tablet Take 0.5 mg by mouth 2 (two) times daily as needed for anxiety.   Yes [provider]  promethazine-codeine (PHENERGAN WITH CODEINE) 6.25-10 MG/5ML syrup Take 5 mLs by mouth every 6 (six) hours as needed for cough. 12/29/16  Yes Triplett, Cari B, FNP  traMADol (ULTRAM) 50 MG tablet Take 1 tablet (50 mg total) by mouth every 6 (six) hours as needed. 08/01/16 08/01/17 Yes Lavonia Drafts, MD    Physical Exam Vitals:  Vitals:   05/05/17 1557  BP: 128/80  Pulse:  91   Patient's last menstrual period was 04/23/2017 (exact date).  General: NAD HEENT: normocephalic, anicteric Pulmonary: No increased work of breathing, CTAB  External: Normal external female genitalia.  Normal urethral meatus, normal  Bartholin's and Skene's glands.    Vagina: Normal vaginal mucosa, no evidence of prolapse.    Cervix: Grossly normal in appearance, no bleeding  Uterus: Non-enlarged, mobile, normal contour.  No CMT  Adnexa: ovaries non-enlarged, no adnexal masses  Rectal: deferred  Lymphatic: no evidence of inguinal lymphadenopathy Extremities: no edema, erythema, or tenderness Neurologic: Grossly  intact Psychiatric: mood appropriate, affect full  Female chaperone present for pelvic and breast  portions of the physical exam  Assessment: 27 y.o. [id number] presenting for STD screening Plan: Problem List Items Addressed This Visit    None    Visit Diagnoses    Screening examination for STD (sexually transmitted disease)    -  Primary   Relevant Orders   GC/Chlamydia Probe Amp   HEP, RPR, HIV Panel   HEP, RPR, HIV Panel       1) Contraception - patient is s/p prior BTL  2) Full STI screening was offered accepted  - Given that the exposure was recent we discussed that given incubation period negative test results do not rule out infection with an STD.  I therefore recommended re-testing in 3 months.  Given one time exposure I do not feel strongly about initiating post-exposure prophylaxis.  3) Discussed STD prevention, patient reports sensitivity to latex and we discussed non-latex condoms both female and female are available.   We discussed while lamb skin condoms are viable for contraception they are not acceptable alternative to latex condoms for prevention of STD's.  There are non-latex options such as polyurethane that are readily available.

## 2017-05-06 ENCOUNTER — Encounter: Payer: Self-pay | Admitting: Obstetrics and Gynecology

## 2017-05-06 LAB — HEP, RPR, HIV PANEL
HIV Screen 4th Generation wRfx: NONREACTIVE
Hepatitis B Surface Ag: NEGATIVE
RPR Ser Ql: NONREACTIVE

## 2017-05-07 ENCOUNTER — Encounter: Payer: Self-pay | Admitting: Obstetrics and Gynecology

## 2017-05-07 LAB — GC/CHLAMYDIA PROBE AMP
CHLAMYDIA, DNA PROBE: NEGATIVE
NEISSERIA GONORRHOEAE BY PCR: NEGATIVE

## 2017-08-05 ENCOUNTER — Other Ambulatory Visit: Payer: Medicaid Other

## 2017-09-23 ENCOUNTER — Other Ambulatory Visit: Payer: Medicaid Other

## 2017-11-10 ENCOUNTER — Emergency Department: Payer: BLUE CROSS/BLUE SHIELD

## 2017-11-10 ENCOUNTER — Other Ambulatory Visit: Payer: Self-pay

## 2017-11-10 ENCOUNTER — Encounter: Payer: Self-pay | Admitting: Emergency Medicine

## 2017-11-10 DIAGNOSIS — Z5321 Procedure and treatment not carried out due to patient leaving prior to being seen by health care provider: Secondary | ICD-10-CM | POA: Insufficient documentation

## 2017-11-10 DIAGNOSIS — R079 Chest pain, unspecified: Secondary | ICD-10-CM | POA: Insufficient documentation

## 2017-11-10 LAB — CBC
HEMATOCRIT: 37.3 % (ref 35.0–47.0)
HEMOGLOBIN: 12.4 g/dL (ref 12.0–16.0)
MCH: 29.6 pg (ref 26.0–34.0)
MCHC: 33.1 g/dL (ref 32.0–36.0)
MCV: 89.4 fL (ref 80.0–100.0)
Platelets: 464 10*3/uL — ABNORMAL HIGH (ref 150–440)
RBC: 4.17 MIL/uL (ref 3.80–5.20)
RDW: 13.9 % (ref 11.5–14.5)
WBC: 12.1 10*3/uL — ABNORMAL HIGH (ref 3.6–11.0)

## 2017-11-10 LAB — BASIC METABOLIC PANEL
ANION GAP: 5 (ref 5–15)
BUN: 10 mg/dL (ref 6–20)
CHLORIDE: 108 mmol/L (ref 101–111)
CO2: 26 mmol/L (ref 22–32)
Calcium: 8.7 mg/dL — ABNORMAL LOW (ref 8.9–10.3)
Creatinine, Ser: 0.62 mg/dL (ref 0.44–1.00)
GFR calc Af Amer: >60 mL/min (ref >60)
Glucose, Bld: 95 mg/dL (ref 65–99)
POTASSIUM: 3.5 mmol/L (ref 3.5–5.1)
Sodium: 139 mmol/L (ref 135–145)

## 2017-11-10 LAB — TROPONIN I: Troponin I: <0.03 ng/mL (ref <0.03)

## 2017-11-10 NOTE — ED Triage Notes (Signed)
Patient ambulatory to triage with steady gait, without difficulty or distress noted; pt reports left sided CP x wk radiating at times to left arm and right side chest; denies hx of same, denies accomp symptoms

## 2017-11-11 ENCOUNTER — Emergency Department
Admission: EM | Admit: 2017-11-11 | Discharge: 2017-11-11 | Disposition: A | Discharge status: Home / Self Care | Payer: BLUE CROSS/BLUE SHIELD | Attending: Emergency Medicine | Admitting: Emergency Medicine

## 2017-11-11 ENCOUNTER — Telehealth: Payer: Self-pay | Admitting: Emergency Medicine

## 2017-11-11 NOTE — Telephone Encounter (Signed)
Called patient due to lwot to inquire about condition and follow up plans. Left message.   

## 2017-12-25 ENCOUNTER — Encounter: Payer: Self-pay | Admitting: Obstetrics and Gynecology

## 2017-12-25 ENCOUNTER — Ambulatory Visit (INDEPENDENT_AMBULATORY_CARE_PROVIDER_SITE_OTHER): Payer: BLUE CROSS/BLUE SHIELD | Admitting: Obstetrics and Gynecology

## 2017-12-25 VITALS — BP 132/84 | HR 85 | Ht 65.0 in | Wt 238.0 lb

## 2017-12-25 DIAGNOSIS — R300 Dysuria: Secondary | ICD-10-CM | POA: Diagnosis not present

## 2017-12-25 DIAGNOSIS — N898 Other specified noninflammatory disorders of vagina: Secondary | ICD-10-CM | POA: Diagnosis not present

## 2017-12-25 MED ORDER — CLOBETASOL PROPIONATE 0.05 % EX CREA: 1.0000 "application " | TOPICAL_CREAM | Freq: Two times a day (BID) | CUTANEOUS | 1 refills | Status: DC

## 2017-12-25 MED ORDER — FLUCONAZOLE 150 MG PO TABS: 150.0000 mg | ORAL_TABLET | Freq: Once | ORAL | 0 refills | Status: AC

## 2017-12-25 NOTE — Progress Notes (Signed)
Obstetrics & Gynecology Office Visit   Chief Complaint:  Chief Complaint  Patient presents with  . Vaginitis    Vaginal discomfort, odor with urination, urine frequency     History of Present Illness: [name] is a 27 y.o. [id number] who LMP was Patient's last menstrual period was 12/03/2017 (approximate)., presents today for a problem visit.   Patient complains of an abnormal vaginal discharge for 1 week. Discharge described as: scant, malodorous and metalic in odor. Vaginal symptoms include none.   Other associated symptoms: none.Menstrual pattern: She had been bleeding regularly. Contraception: tubal ligation.  She admits to recent antibiotic exposure, denies changes in soaps, detergents coinciding with the onset of her symptoms.  She has not previously self treated or been under treatment by another provider for these symptoms.   Currently on amoxicillin prior to root canal.  She has noted some dysuria and urgency.  Review of Systems: Review of Systems  Constitutional: Negative.   Genitourinary: Positive for dysuria and frequency. Negative for flank pain, hematuria and urgency.  Skin: Negative.     Past Medical History:  Past Medical History:  Diagnosis Date  . BRCA negative 08/2016  . Endometriosis   . Ovarian cyst     Past Surgical History:  Past Surgical History:  Procedure Laterality Date  . IUD REMOVAL    . laproscopy w/ attempt to remove L cyst but cyst was no longer present Left 2015  . TUBAL LIGATION  06/21/2015   Procedure: POST PARTUM TUBAL LIGATION;  Surgeon: Honor Loh Ward, MD;  Location: [facility] ORS;  Service: Gynecology;;    Gynecologic History: Patient's last menstrual period was 12/03/2017 (approximate).  Obstetric History: [id number]  Family History:  Family History  Problem Relation Age of Onset  . Hypertension Mother   . Diabetes Mother   . Cancer Mother        Ovarian  . Hypertension Maternal Grandmother     Social History:  Social  History   Socioeconomic History  . Marital status: Single    Spouse name: Not on file  . Number of children: Not on file  . Years of education: Not on file  . Highest education level: Not on file  Occupational History  . Not on file  Social Needs  . Financial resource strain: Not on file  . Food insecurity:    Worry: Not on file    Inability: Not on file  . Transportation needs:    Medical: Not on file    Non-medical: Not on file  Tobacco Use  . Smoking status: Current Every Day Smoker    Packs/day: 0.50    Types: Cigarettes    Last attempt to quit: 02/18/2015    Years since quitting: 2.8  . Smokeless tobacco: Never Used  Substance and Sexual Activity  . Alcohol use: No    Comment: occ  . Drug use: No  . Sexual activity: Yes    Birth control/protection: None  Lifestyle  . Physical activity:    Days per week: Not on file    Minutes per session: Not on file  . Stress: Not on file  Relationships  . Social connections:    Talks on phone: Not on file    Gets together: Not on file    Attends religious service: Not on file    Active member of club or organization: Not on file    Attends meetings of clubs or organizations: Not on file  Relationship status: Not on file  . Intimate partner violence:    Fear of current or ex partner: Not on file    Emotionally abused: Not on file    Physically abused: Not on file    Forced sexual activity: Not on file  Other Topics Concern  . Not on file  Social History Narrative  . Not on file    Allergies:  Allergies  Allergen Reactions  . Penicillins     Reaction unknown.      Medications: Prior to Admission medications   Medication Sig Start Date End Date Taking? Authorizing Provider  butalbital-acetaminophen-caffeine (FIORICET, ESGIC) 50-325-40 MG tablet Take 1-2 tablets by mouth every 6 (six) hours as needed for headache. 04/30/17 04/30/18 Yes Loney Hering, MD  clobetasol cream (TEMOVATE) 1.19 % Apply 1 application  topically 2 (two) times daily. Patient not taking: Reported on 12/25/2017 02/23/17   Malachy Mood, MD  clonazePAM (KLONOPIN) 0.5 MG tablet Take 0.5 mg by mouth 2 (two) times daily as needed for anxiety.    [provider]  promethazine-codeine (PHENERGAN WITH CODEINE) 6.25-10 MG/5ML syrup Take 5 mLs by mouth every 6 (six) hours as needed for cough. Patient not taking: Reported on 12/25/2017 12/29/16   Victorino Dike, FNP    Physical Exam Vitals:  Vitals:   12/25/17 1457  BP: 132/84  Pulse: 85   Patient's last menstrual period was 12/03/2017 (approximate).  General: NAD HEENT: normocephalic, anicteric Thyroid: no enlargement, no palpable nodules Pulmonary: No increased work of breathing Cardiovascular: RRR, distal pulses 2+ Abdomen: NABS, soft, non-tender, non-distended.  Umbilicus without lesions.  No hepatomegaly, splenomegaly or masses palpable. No evidence of hernia  Genitourinary:  External: Normal external female genitalia.  Normal urethral meatus, normal  Bartholin's and Skene's glands.    Vagina: Normal vaginal mucosa, no evidence of prolapse.    Cervix: Grossly normal in appearance, no bleeding  Uterus: Non-enlarged, mobile, normal contour.  No CMT  Adnexa: ovaries non-enlarged, no adnexal masses  Rectal: deferred  Lymphatic: no evidence of inguinal lymphadenopathy Extremities: no edema, erythema, or tenderness Neurologic: Grossly intact Psychiatric: mood appropriate, affect full  Female chaperone present for pelvic  portions of the physical exam Wet Prep: PH: 4.5 Clue Cells: Negative Fungal elements: Positive Trichomonas: Negative  No results found for this or any previous visit (from the past 24 hour(s)).  Assessment: 27 y.o. [id number] with dysuria and vaginal candida  Plan: Problem List Items Addressed This Visit    None    Visit Diagnoses    Dysuria    -  Primary   Relevant Orders   Urine Culture   Vaginal discharge       Relevant Orders    NuSwab VG+, Candida 6sp   Vaginal irritation       Relevant Orders   NuSwab VG+, Candida 6sp      1) Risk factors for bacterial vaginosis and candida infections discussed.  We discussed normal vaginal flora/microbiome.  Any factors that may alter the microbiome increase the risk of these opportunistic infections.  These include changes in pH, antibiotic exposures, diabetes, wet bathing suits etc.  We discussed that treatment is aimed at eradicating abnormal bacterial overgrowth and or yeast.  There may be some role for vaginal probiotics in restoring normal vaginal flora.   - Rx diflucan written - STI testing offered and accepted  - Urine culture secondary to dysuria, UA negative other than blood  Malachy Mood, MD, [place], [place]  12/25/2017, 3:32 PM

## 2017-12-27 LAB — URINE CULTURE

## 2017-12-29 ENCOUNTER — Emergency Department
Admission: EM | Admit: 2017-12-29 | Discharge: 2017-12-30 | Disposition: A | Discharge status: Home / Self Care | Payer: BLUE CROSS/BLUE SHIELD | Attending: Emergency Medicine | Admitting: Emergency Medicine

## 2017-12-29 ENCOUNTER — Emergency Department: Payer: BLUE CROSS/BLUE SHIELD

## 2017-12-29 ENCOUNTER — Encounter: Payer: Self-pay | Admitting: Emergency Medicine

## 2017-12-29 ENCOUNTER — Other Ambulatory Visit: Payer: Self-pay

## 2017-12-29 DIAGNOSIS — R0602 Shortness of breath: Secondary | ICD-10-CM | POA: Diagnosis present

## 2017-12-29 DIAGNOSIS — F17210 Nicotine dependence, cigarettes, uncomplicated: Secondary | ICD-10-CM | POA: Insufficient documentation

## 2017-12-29 DIAGNOSIS — Z79899 Other long term (current) drug therapy: Secondary | ICD-10-CM | POA: Diagnosis not present

## 2017-12-29 DIAGNOSIS — R0789 Other chest pain: Secondary | ICD-10-CM | POA: Insufficient documentation

## 2017-12-29 DIAGNOSIS — J40 Bronchitis, not specified as acute or chronic: Secondary | ICD-10-CM | POA: Diagnosis not present

## 2017-12-29 LAB — BASIC METABOLIC PANEL
ANION GAP: 9 (ref 5–15)
BUN: 7 mg/dL (ref 6–20)
CALCIUM: 8.7 mg/dL — AB (ref 8.9–10.3)
CO2: 21 mmol/L — ABNORMAL LOW (ref 22–32)
Chloride: 108 mmol/L (ref 101–111)
Creatinine, Ser: 0.76 mg/dL (ref 0.44–1.00)
GLUCOSE: 98 mg/dL (ref 65–99)
POTASSIUM: 3.4 mmol/L — AB (ref 3.5–5.1)
SODIUM: 138 mmol/L (ref 135–145)

## 2017-12-29 LAB — CBC
HCT: 34.9 % — ABNORMAL LOW (ref 35.0–47.0)
HEMOGLOBIN: 12.2 g/dL (ref 12.0–16.0)
MCH: 30.5 pg (ref 26.0–34.0)
MCHC: 34.9 g/dL (ref 32.0–36.0)
MCV: 87.4 fL (ref 80.0–100.0)
Platelets: 394 10*3/uL (ref 150–440)
RBC: 4 MIL/uL (ref 3.80–5.20)
RDW: 14 % (ref 11.5–14.5)
WBC: 17.1 10*3/uL — AB (ref 3.6–11.0)

## 2017-12-29 LAB — TROPONIN I: Troponin I: <0.03 ng/mL (ref <0.03)

## 2017-12-29 MED ORDER — AZITHROMYCIN 500 MG PO TABS
500.0000 mg | ORAL_TABLET | Freq: Once | ORAL | Status: AC
  Administered 2017-12-29: 500 mg via ORAL
  Filled 2017-12-29: qty 1

## 2017-12-29 MED ORDER — IPRATROPIUM-ALBUTEROL 0.5-2.5 (3) MG/3ML IN SOLN
3.0000 mL | Freq: Once | RESPIRATORY_TRACT | Status: AC
  Administered 2017-12-29: 3 mL via RESPIRATORY_TRACT
  Filled 2017-12-29: qty 3

## 2017-12-29 MED ORDER — HYDROCOD POLST-CPM POLST ER 10-8 MG/5ML PO SUER: 5.0000 mL | Freq: Two times a day (BID) | ORAL | 0 refills | Status: DC

## 2017-12-29 MED ORDER — KETOROLAC TROMETHAMINE 30 MG/ML IJ SOLN
60.0000 mg | Freq: Once | INTRAMUSCULAR | Status: AC
  Administered 2017-12-29: 60 mg via INTRAMUSCULAR
  Filled 2017-12-29: qty 2

## 2017-12-29 MED ORDER — AZITHROMYCIN 250 MG PO TABS: 250.0000 mg | ORAL_TABLET | Freq: Every day | ORAL | 0 refills | Status: DC

## 2017-12-29 MED ORDER — PREDNISONE 20 MG PO TABS
60.0000 mg | ORAL_TABLET | Freq: Once | ORAL | Status: AC
  Administered 2017-12-29: 60 mg via ORAL
  Filled 2017-12-29: qty 6

## 2017-12-29 MED ORDER — PREDNISONE 20 MG PO TABS: ORAL_TABLET | ORAL | 0 refills | Status: DC

## 2017-12-29 MED ORDER — ALBUTEROL SULFATE 108 (90 BASE) MCG/ACT IN AEPB: 2.0000 | INHALATION_SPRAY | RESPIRATORY_TRACT | 0 refills | Status: AC | PRN

## 2017-12-29 NOTE — Discharge Instructions (Signed)
1.  Finish antibiotic as prescribed (azithromycin 250 mg daily x4 days). 2.  Finish steroid as prescribed (prednisone 60 mg daily x4 days). 3.  You may use albuterol inhaler 2 puffs every 4 hours as needed for wheezing/difficulty breathing. 4.  You may take Tussionex as needed for cough. 5.  Return to the ER for worsening symptoms, persistent vomiting, difficulty breathing or other concerns.

## 2017-12-29 NOTE — ED Triage Notes (Signed)
Pt to ED from home c/o SOB, productive cough, and chest tightness x2 days.  States feels like an elephant on chest.  Denies cardiac or respiratory conditions.

## 2017-12-29 NOTE — ED Notes (Signed)
Pt reports having chest pain which started two days ago. Pt reports a tight feeling. Pt alert and oriented x 4.

## 2017-12-29 NOTE — ED Provider Notes (Signed)
[facility] Emergency Department Provider Note   ____________________________________________   First MD Initiated Contact with Patient 12/29/17 2319     (approximate)  I have reviewed the triage vital signs and the nursing notes.   HISTORY  Chief Complaint Shortness of Breath and Chest Pain    HPI Abigail [name] is a 27 y.o. female who presents to the ED from home with a chief complaint of productive cough, shortness of breath and chest pain.  Symptoms x2 days.  Complains of cough productive of yellow/green sputum, wheezing, shortness of breath and anterior chest wall pain worsened with cough and movements.  Denies associated fever, chills, abdominal pain, nausea, vomiting, diarrhea.  Denies recent travel, trauma or hormone use.   Past Medical History:  Diagnosis Date  . BRCA negative 08/2016  . Endometriosis   . Ovarian cyst     Patient Active Problem List   Diagnosis Date Noted  . Vaginitis and vulvovaginitis 11/03/2016  . Labor and delivery, indication for care 06/20/2015  . Lumbago 06/18/2015  . Pelvic pain affecting pregnancy 05/28/2015  . Abdominal pain affecting pregnancy 04/12/2015    Past Surgical History:  Procedure Laterality Date  . IUD REMOVAL    . laproscopy w/ attempt to remove L cyst but cyst was no longer present Left 2015  . TUBAL LIGATION  06/21/2015   Procedure: POST PARTUM TUBAL LIGATION;  Surgeon: Honor Loh Ward, MD;  Location: [facility] ORS;  Service: Gynecology;;    Prior to Admission medications   Medication Sig Start Date End Date Taking? Authorizing Provider  Albuterol Sulfate (PROAIR RESPICLICK) 846 (90 Base) MCG/ACT AEPB Inhale 2 puffs into the lungs every 4 (four) hours as needed. 12/29/17   Paulette Blanch, MD  azithromycin (ZITHROMAX) 250 MG tablet Take 1 tablet (250 mg total) by mouth daily. 12/29/17   Paulette Blanch, MD  butalbital-acetaminophen-caffeine (FIORICET, ESGIC) [phone] MG tablet Take 1-2 tablets by mouth  every 6 (six) hours as needed for headache. 04/30/17 04/30/18  Loney Hering, MD  chlorpheniramine-HYDROcodone (TUSSIONEX PENNKINETIC ER) 10-8 MG/5ML SUER Take 5 mLs by mouth 2 (two) times daily. 12/29/17   Paulette Blanch, MD  clobetasol cream (TEMOVATE) 0.17 % Apply 1 application topically 2 (two) times daily. 12/25/17   Malachy Mood, MD  clonazePAM (KLONOPIN) 0.5 MG tablet Take 0.5 mg by mouth 2 (two) times daily as needed for anxiety.    [provider]  predniSONE (DELTASONE) 20 MG tablet 3 tablets daily x 4 days 12/29/17   Paulette Blanch, MD  promethazine-codeine [facility] WITH CODEINE) 6.25-10 MG/5ML syrup Take 5 mLs by mouth every 6 (six) hours as needed for cough. Patient not taking: Reported on 12/25/2017 12/29/16   Victorino Dike, FNP    Allergies Penicillins  Family History  Problem Relation Age of Onset  . Hypertension Mother   . Diabetes Mother   . Cancer Mother        Ovarian  . Hypertension Maternal Grandmother     Social History Social History   Tobacco Use  . Smoking status: Current Every Day Smoker    Packs/day: 0.50    Types: Cigarettes    Last attempt to quit: 02/18/2015    Years since quitting: 2.8  . Smokeless tobacco: Never Used  Substance Use Topics  . Alcohol use: No    Comment: occ  . Drug use: No    Review of Systems  Constitutional: No fever/chills Eyes: No visual changes. ENT: No sore  throat. Cardiovascular: Positive for chest pain. Respiratory: Positive for cough, wheezing and shortness of breath. Gastrointestinal: No abdominal pain.  No nausea, no vomiting.  No diarrhea.  No constipation. Genitourinary: Negative for dysuria. Musculoskeletal: Negative for back pain. Skin: Negative for rash. Neurological: Negative for headaches, focal weakness or numbness.   ____________________________________________   PHYSICAL EXAM:  VITAL SIGNS: ED Triage Vitals  Enc Vitals Group     BP 12/29/17 2142 (!) 141/83     Pulse Rate  12/29/17 2142 (!) 102     Resp 12/29/17 2142 16     Temp 12/29/17 2142 100 F (37.8 C)     Temp Source 12/29/17 2142 Oral     SpO2 12/29/17 2142 96 %     Weight 12/29/17 2143 235 lb (106.6 kg)     Height 12/29/17 2143 5' 5" (1.651 m)     Head Circumference --      Peak Flow --      Pain Score 12/29/17 2143 9     Pain Loc --      Pain Edu? --      Excl. in [place]? --     Constitutional: Alert and oriented. Well appearing and in mild acute distress. Eyes: Conjunctivae are normal. PERRL. EOMI. Head: Atraumatic. Nose: No congestion/rhinnorhea. Mouth/Throat: Mucous membranes are moist.  Oropharynx non-erythematous. Neck: No stridor.   Cardiovascular: Normal rate, regular rhythm. Grossly normal heart sounds.  Good peripheral circulation. Respiratory: Normal respiratory effort.  No retractions. Lungs with scattered wheezing.  Anterior chest wall tender to palpation. Gastrointestinal: Soft and nontender. No distention. No abdominal bruits. No CVA tenderness. Musculoskeletal: No lower extremity tenderness nor edema.  No joint effusions. Neurologic:  Normal speech and language. No gross focal neurologic deficits are appreciated. No gait instability. Skin:  Skin is warm, dry and intact. No rash noted. Psychiatric: Mood and affect are normal. Speech and behavior are normal.  ____________________________________________   LABS (all labs ordered are listed, but only abnormal results are displayed)  Labs Reviewed  BASIC METABOLIC PANEL - Abnormal; Notable for the following components:      Result Value   Potassium 3.4 (*)    CO2 21 (*)    Calcium 8.7 (*)    All other components within normal limits  CBC - Abnormal; Notable for the following components:   WBC 17.1 (*)    HCT 34.9 (*)    All other components within normal limits  TROPONIN I  POC URINE PREG, ED   ____________________________________________  EKG  ED ECG REPORT I, SUNG,JADE J, the attending physician, personally viewed  and interpreted this ECG.   Date: 12/29/2017  EKG Time: 2144  Rate: 92  Rhythm: normal EKG, normal sinus rhythm  Axis: Normal  Intervals:none  ST&T Change: Nonspecific  ____________________________________________  RADIOLOGY  ED MD interpretation: Mild central bronchial thickening  Official radiology report(s): Dg Chest 2 View  Result Date: 12/29/2017 CLINICAL DATA:  Chest pain. Shortness of breath and productive cough. EXAM: CHEST - 2 VIEW COMPARISON:  Radiographs 11/10/2017 FINDINGS: The cardiomediastinal contours are normal. Mild central bronchial thickening and subsegmental lingular atelectasis. Pulmonary vasculature is normal. No confluent consolidation, pleural effusion, or pneumothorax. No acute osseous abnormalities are seen. IMPRESSION: Mild central bronchial thickening and subsegmental atelectasis in the lingula. Electronically Signed   By: Jeb Levering M.D.   On: 12/29/2017 22:02    ____________________________________________   PROCEDURES  Procedure(s) performed: None  Procedures  Critical Care performed: No  ____________________________________________   INITIAL IMPRESSION /  ASSESSMENT AND PLAN / ED COURSE  As part of my medical decision making, I reviewed the following data within the [place] notes reviewed and incorporated, Labs reviewed, EKG interpreted, Radiograph reviewed  and Notes from prior ED visits   27 year old healthy female who presents with a 2-day history of productive cough, wheezing, shortness of breath and chest wall pain. Differential includes, but is not limited to, viral syndrome, bronchitis including COPD exacerbation, pneumonia, reactive airway disease including asthma, CHF including exacerbation with or without pulmonary/interstitial edema, pneumothorax, ACS, thoracic trauma, and pulmonary embolism.   Laboratory and imaging results notable for leukocytosis.  Reproducible chest pain on exam.  Clinically  patient gums are consistent with bronchitis.  Will administer DuoNeb, 60 mg oral prednisone, 60 mg IM Toradol and reassess.   Clinical Course as of Dec 30 99  Wed Dec 30, 2017  0100 Wheezing resolved.  Room air saturation 96%.  Strict return precautions given.  Patient verbalizes understanding agrees with plan of care.   [JS]    Clinical Course User Index [JS] Paulette Blanch, MD     ____________________________________________   FINAL CLINICAL IMPRESSION(S) / ED DIAGNOSES  Final diagnoses:  Bronchitis  Chest wall pain     ED Discharge Orders        Ordered    azithromycin (ZITHROMAX) 250 MG tablet  Daily     12/29/17 2329    chlorpheniramine-HYDROcodone (TUSSIONEX PENNKINETIC ER) 10-8 MG/5ML SUER  2 times daily     12/29/17 2329    predniSONE (DELTASONE) 20 MG tablet     12/29/17 2329    Albuterol Sulfate (PROAIR RESPICLICK) 952 (90 Base) MCG/ACT AEPB  Every 4 hours PRN     12/29/17 2329       Note:  This document was prepared using Dragon voice recognition software and may include unintentional dictation errors.    Paulette Blanch, MD 12/30/17 [phone]

## 2018-01-07 LAB — NUSWAB VG+, CANDIDA 6SP
CANDIDA GLABRATA, NAA: NEGATIVE
CANDIDA KRUSEI, NAA: NEGATIVE
CANDIDA LUSITANIAE, NAA: NEGATIVE
CANDIDA TROPICALIS, NAA: NEGATIVE
Candida albicans, NAA: POSITIVE — AB
Candida parapsilosis, NAA: NEGATIVE
Chlamydia trachomatis, NAA: NEGATIVE
NEISSERIA GONORRHOEAE, NAA: NEGATIVE
Trich vag by NAA: NEGATIVE

## 2018-01-09 ENCOUNTER — Encounter (INDEPENDENT_AMBULATORY_CARE_PROVIDER_SITE_OTHER): Payer: Self-pay

## 2018-07-02 ENCOUNTER — Emergency Department
Admission: EM | Admit: 2018-07-02 | Discharge: 2018-07-02 | Disposition: A | Discharge status: Home / Self Care | Payer: BLUE CROSS/BLUE SHIELD | Attending: Emergency Medicine | Admitting: Emergency Medicine

## 2018-07-02 ENCOUNTER — Other Ambulatory Visit: Payer: Self-pay

## 2018-07-02 DIAGNOSIS — K611 Rectal abscess: Secondary | ICD-10-CM

## 2018-07-02 DIAGNOSIS — Z79899 Other long term (current) drug therapy: Secondary | ICD-10-CM | POA: Insufficient documentation

## 2018-07-02 DIAGNOSIS — L0291 Cutaneous abscess, unspecified: Secondary | ICD-10-CM

## 2018-07-02 DIAGNOSIS — F17210 Nicotine dependence, cigarettes, uncomplicated: Secondary | ICD-10-CM | POA: Insufficient documentation

## 2018-07-02 MED ORDER — AMOXICILLIN-POT CLAVULANATE 875-125 MG PO TABS: 1.0000 | ORAL_TABLET | Freq: Two times a day (BID) | ORAL | 0 refills | Status: AC

## 2018-07-02 MED ORDER — CLINDAMYCIN HCL 300 MG PO CAPS: 300.0000 mg | ORAL_CAPSULE | Freq: Four times a day (QID) | ORAL | 0 refills | Status: AC

## 2018-07-02 MED ORDER — LIDOCAINE HCL (PF) 1 % IJ SOLN
5.0000 mL | Freq: Once | INTRAMUSCULAR | Status: AC
  Administered 2018-07-02: 5 mL via INTRADERMAL
  Filled 2018-07-02: qty 5

## 2018-07-02 MED ORDER — CLINDAMYCIN PHOSPHATE 600 MG/4ML IJ SOLN
600.0000 mg | Freq: Once | INTRAMUSCULAR | Status: AC
  Administered 2018-07-02: 600 mg via INTRAMUSCULAR

## 2018-07-02 MED ORDER — CLINDAMYCIN PHOSPHATE 300 MG/2ML IJ SOLN
INTRAMUSCULAR | Status: AC
  Filled 2018-07-02: qty 4

## 2018-07-02 NOTE — ED Triage Notes (Signed)
Pt arrived via POV with reports of pain her buttocks, since Saturday, pt states the pain is present when walking and sitting.  Pt denies any rectal bleeding, unknown if hemorrhoid or not.

## 2018-07-02 NOTE — ED Notes (Signed)
See triage note  Presents with pain to buttock area for the past couple of days  States pain increases with sitting and ambulation  Unsure if there is an abscess area      Left side of buttocks is tender to touch

## 2018-07-02 NOTE — ED Notes (Signed)
Abscess dressed

## 2018-07-02 NOTE — ED Provider Notes (Signed)
[facility] Emergency Department Provider Note  ____________________________________________  Time seen: Approximately 10:06 AM  I have reviewed the triage vital signs and the nursing notes.   HISTORY  Chief Complaint Rectal Pain    HPI Abigail [name] is a 27 y.o. female presents emergency department for evaluation of abscess to buttocks for 5 days.  Patient states that the inside of her left buttocks is sore.  Patient states that this is never happened before.  No change to her bowels.  No fever, chills.   Past Medical History:  Diagnosis Date  . BRCA negative 08/2016  . Endometriosis   . Ovarian cyst     Patient Active Problem List   Diagnosis Date Noted  . Vaginitis and vulvovaginitis 11/03/2016  . Labor and delivery, indication for care 06/20/2015  . Lumbago 06/18/2015  . Pelvic pain affecting pregnancy 05/28/2015  . Abdominal pain affecting pregnancy 04/12/2015    Past Surgical History:  Procedure Laterality Date  . IUD REMOVAL    . laproscopy w/ attempt to remove L cyst but cyst was no longer present Left 2015  . TUBAL LIGATION  06/21/2015   Procedure: POST PARTUM TUBAL LIGATION;  Surgeon: Honor Loh Ward, MD;  Location: [facility] ORS;  Service: Gynecology;;    Prior to Admission medications   Medication Sig Start Date End Date Taking? Authorizing Provider  Albuterol Sulfate (PROAIR RESPICLICK) 384 (90 Base) MCG/ACT AEPB Inhale 2 puffs into the lungs every 4 (four) hours as needed. 12/29/17   Paulette Blanch, MD  amoxicillin-clavulanate (AUGMENTIN) 875-125 MG tablet Take 1 tablet by mouth 2 (two) times daily for 10 days. 07/02/18 07/12/18  Laban Emperor, PA-C  azithromycin (ZITHROMAX) 250 MG tablet Take 1 tablet (250 mg total) by mouth daily. 12/29/17   Paulette Blanch, MD  chlorpheniramine-HYDROcodone (TUSSIONEX PENNKINETIC ER) 10-8 MG/5ML SUER Take 5 mLs by mouth 2 (two) times daily. 12/29/17   Paulette Blanch, MD  clindamycin (CLEOCIN) 300 MG capsule  Take 1 capsule (300 mg total) by mouth 4 (four) times daily for 10 days. 07/02/18 07/12/18  Laban Emperor, PA-C  clobetasol cream (TEMOVATE) 6.65 % Apply 1 application topically 2 (two) times daily. 12/25/17   Malachy Mood, MD  clonazePAM (KLONOPIN) 0.5 MG tablet Take 0.5 mg by mouth 2 (two) times daily as needed for anxiety.    [provider]  predniSONE (DELTASONE) 20 MG tablet 3 tablets daily x 4 days 12/29/17   Paulette Blanch, MD  promethazine-codeine [facility] WITH CODEINE) 6.25-10 MG/5ML syrup Take 5 mLs by mouth every 6 (six) hours as needed for cough. Patient not taking: Reported on 12/25/2017 12/29/16   Victorino Dike, FNP    Allergies Penicillins  Family History  Problem Relation Age of Onset  . Hypertension Mother   . Diabetes Mother   . Cancer Mother        Ovarian  . Hypertension Maternal Grandmother     Social History Social History   Tobacco Use  . Smoking status: Current Every Day Smoker    Packs/day: 0.50    Types: Cigarettes    Last attempt to quit: 02/18/2015    Years since quitting: 3.3  . Smokeless tobacco: Never Used  Substance Use Topics  . Alcohol use: No    Comment: occ  . Drug use: No     Review of Systems  Constitutional: No fever/chills Gastrointestinal: No abdominal pain.  No nausea, no vomiting.  Musculoskeletal: Negative for musculoskeletal pain. Skin: Negative for  abrasions, lacerations, ecchymosis.   ____________________________________________   PHYSICAL EXAM:  VITAL SIGNS: ED Triage Vitals [07/02/18 0842]  Enc Vitals Group     BP 130/88     Pulse Rate 94     Resp 18     Temp 98.1 F (36.7 C)     Temp Source Oral     SpO2 100 %     Weight 228 lb (103.4 kg)     Height 5' 5"  (1.651 m)     Head Circumference      Peak Flow      Pain Score 8     Pain Loc      Pain Edu?      Excl. in [place]?      Constitutional: Alert and oriented. Well appearing and in no acute distress. Eyes: Conjunctivae are normal. PERRL.  EOMI. Head: Atraumatic. ENT:      Ears:      Nose: No congestion/rhinnorhea.      Mouth/Throat: Mucous membranes are moist.  Neck: No stridor. Cardiovascular: Normal rate, regular rhythm.  Good peripheral circulation. Respiratory: Normal respiratory effort without tachypnea or retractions. Lungs CTAB. Good air entry to the bases with no decreased or absent breath sounds. Gastrointestinal: Bowel sounds 4 quadrants. Soft and nontender to palpation. No guarding or rigidity. No palpable masses. No distention.  Genitourinary: 1 cm x 1 cm area of swelling and fluctuance to left buttocks at 3 o'clock position of the rectum outside of the sphincter. No surrounding erythema.  Musculoskeletal: Full range of motion to all extremities. No gross deformities appreciated. Neurologic:  Normal speech and language. No gross focal neurologic deficits are appreciated.  Skin:  Skin is warm, dry and intact. No rash noted. Psychiatric: Mood and affect are normal. Speech and behavior are normal. Patient exhibits appropriate insight and judgement.   ____________________________________________   LABS (all labs ordered are listed, but only abnormal results are displayed)  Labs Reviewed - No data to display ____________________________________________  EKG   ____________________________________________  RADIOLOGY   No results found.  ____________________________________________    PROCEDURES  Procedure(s) performed:    Procedures  INCISION AND DRAINAGE Performed by: Laban Emperor Consent: Verbal consent obtained. Risks and benefits: risks, benefits and alternatives were discussed Type: abscess  Body area: buttocks  Anesthesia: local infiltration  Incision was made with a scalpel.  Local anesthetic: lidocaine 1 % without epinephrine  Anesthetic total: 2  ml  Complexity: complex Blunt dissection to break up loculations  Drainage: purulent  Drainage amount: 3 ccs  Packing  material: 1/4 in iodoform gauze  Patient tolerance: Patient tolerated the procedure well with no immediate complications.    Medications  lidocaine (PF) (XYLOCAINE) 1 % injection 5 mL (5 mLs Intradermal Given by Other 07/02/18 1035)  clindamycin (CLEOCIN) injection 600 mg (600 mg Intramuscular Given 07/02/18 1049)     ____________________________________________   INITIAL IMPRESSION / ASSESSMENT AND PLAN / ED COURSE  Pertinent labs & imaging results that were available during my care of the patient were reviewed by me and considered in my medical decision making (see chart for details).  Review of the [place] CSRS was performed in accordance of the [place] prior to dispensing any controlled drugs.   Patient's diagnosis is consistent with rectal abscess.  Abscess is on left buttocks just outside of sphincter to rectum.  Risks and benefits of incision and drainage performed by me given location vs  blood work with CT scan and surgical consult for further evaluation were discussed  with patient.  Patient elects to have incision and drainage completed today by me. Abscess was successfully drained with a very shallow incision outside of the sphincter.  and will Patient will begin antibiotics and return to the emergency department on Sunday for wound recheck.   She will begin clindamycin and Augmentin.   Patient is given ED precautions to return to the ED for any worsening or new symptoms.     ____________________________________________  FINAL CLINICAL IMPRESSION(S) / ED DIAGNOSES  Final diagnoses:  Abscess  Rectal abscess      NEW MEDICATIONS STARTED DURING THIS VISIT:  ED Discharge Orders         Ordered    clindamycin (CLEOCIN) 300 MG capsule  4 times daily     07/02/18 1121    amoxicillin-clavulanate (AUGMENTIN) 875-125 MG tablet  2 times daily     12 /13/19 1121              This chart was dictated using voice recognition software/Dragon. Despite best efforts to  proofread, errors can occur which can change the meaning. Any change was purely unintentional.    Laban Emperor, PA-C 07/02/18 1551    Arta Silence, MD 07/02/18 [phone]

## 2018-07-04 ENCOUNTER — Other Ambulatory Visit: Payer: Self-pay

## 2018-07-04 ENCOUNTER — Encounter: Payer: Self-pay | Admitting: Emergency Medicine

## 2018-07-04 ENCOUNTER — Emergency Department
Admission: EM | Admit: 2018-07-04 | Discharge: 2018-07-04 | Disposition: A | Discharge status: Home / Self Care | Payer: Medicaid Other | Attending: Emergency Medicine | Admitting: Emergency Medicine

## 2018-07-04 DIAGNOSIS — Z5189 Encounter for other specified aftercare: Secondary | ICD-10-CM

## 2018-07-04 DIAGNOSIS — L0231 Cutaneous abscess of buttock: Secondary | ICD-10-CM | POA: Insufficient documentation

## 2018-07-04 DIAGNOSIS — F17210 Nicotine dependence, cigarettes, uncomplicated: Secondary | ICD-10-CM | POA: Insufficient documentation

## 2018-07-04 NOTE — ED Triage Notes (Signed)
Here for wound check.  Had I+D of abscess to buttock.

## 2018-07-04 NOTE — ED Provider Notes (Signed)
[facility] Emergency Department Provider Note   ____________________________________________   First MD Initiated Contact with Patient 07/04/18 1049     (approximate)  I have reviewed the triage vital signs and the nursing notes.   HISTORY  Chief Complaint Wound Check  HPI Abigail [name] is a 27 y.o. female presents to the ED for packing removal from an abscess that was opened 2 days ago.  Patient denies any continued problems.  She denies any fever, chills, nausea or vomiting.   Past Medical History:  Diagnosis Date  . BRCA negative 08/2016  . Endometriosis   . Ovarian cyst     Patient Active Problem List   Diagnosis Date Noted  . Vaginitis and vulvovaginitis 11/03/2016  . Labor and delivery, indication for care 06/20/2015  . Lumbago 06/18/2015  . Pelvic pain affecting pregnancy 05/28/2015  . Abdominal pain affecting pregnancy 04/12/2015    Past Surgical History:  Procedure Laterality Date  . IUD REMOVAL    . laproscopy w/ attempt to remove L cyst but cyst was no longer present Left 2015  . TUBAL LIGATION  06/21/2015   Procedure: POST PARTUM TUBAL LIGATION;  Surgeon: Honor Loh Ward, MD;  Location: [facility] ORS;  Service: Gynecology;;    Prior to Admission medications   Medication Sig Start Date End Date Taking? Authorizing Provider  Albuterol Sulfate (PROAIR RESPICLICK) 016 (90 Base) MCG/ACT AEPB Inhale 2 puffs into the lungs every 4 (four) hours as needed. 12/29/17   Paulette Blanch, MD  amoxicillin-clavulanate (AUGMENTIN) 875-125 MG tablet Take 1 tablet by mouth 2 (two) times daily for 10 days. 07/02/18 07/12/18  Laban Emperor, PA-C  clindamycin (CLEOCIN) 300 MG capsule Take 1 capsule (300 mg total) by mouth 4 (four) times daily for 10 days. 07/02/18 07/12/18  Laban Emperor, PA-C  clobetasol cream (TEMOVATE) 0.10 % Apply 1 application topically 2 (two) times daily. 12/25/17   Malachy Mood, MD  clonazePAM (KLONOPIN) 0.5 MG tablet Take 0.5  mg by mouth 2 (two) times daily as needed for anxiety.    [provider]    Allergies Penicillins  Family History  Problem Relation Age of Onset  . Hypertension Mother   . Diabetes Mother   . Cancer Mother        Ovarian  . Hypertension Maternal Grandmother     Social History Social History   Tobacco Use  . Smoking status: Current Every Day Smoker    Packs/day: 0.50    Types: Cigarettes    Last attempt to quit: 02/18/2015    Years since quitting: 3.3  . Smokeless tobacco: Never Used  Substance Use Topics  . Alcohol use: No    Comment: occ  . Drug use: No    Review of Systems Constitutional: No fever/chills Cardiovascular: Denies chest pain. Respiratory: Denies shortness of breath. Musculoskeletal: Negative for back pain. Skin: Healing abscess. Neurological: Negative for headaches ___________________________________________   PHYSICAL EXAM:  VITAL SIGNS: ED Triage Vitals  Enc Vitals Group     BP 07/04/18 1000 134/86     Pulse Rate 07/04/18 1000 86     Resp 07/04/18 1000 16     Temp 07/04/18 1000 98.6 F (37 C)     Temp Source 07/04/18 1000 Oral     SpO2 07/04/18 1000 97 %     Weight 07/04/18 0957 228 lb (103.4 kg)     Height 07/04/18 0957 _0  (1.651 m)     Head Circumference --  Peak Flow --      Pain Score 07/04/18 0957 0     Pain Loc --      Pain Edu? --      Excl. in [place]? --    Constitutional: Alert and oriented. Well appearing and in no acute distress. Eyes: Conjunctivae are normal.  Head: Atraumatic. Neck: No stridor.   Cardiovascular:  Good peripheral circulation. Respiratory: Normal respiratory effort.  No retractions.  Musculoskeletal: Moves upper and lower extremities without any difficulty. Neurologic:  Normal speech and language. No gross focal neurologic deficits are appreciated. No gait instability. Skin:  Skin is warm, dry.  Resolving abscess. Psychiatric: Mood and affect are normal. Speech and behavior are  normal.  ____________________________________________   LABS (all labs ordered are listed, but only abnormal results are displayed)  Labs Reviewed - No data to display   PROCEDURES  Procedure(s) performed: None  Procedures  Critical Care performed: No  ____________________________________________   INITIAL IMPRESSION / ASSESSMENT AND PLAN / ED COURSE  As part of my medical decision making, I reviewed the following data within the electronic MEDICAL RECORD NUMBER Notes from prior ED visits and [place] Controlled Substance Database  Patient presents to the ED for packing removal from an abscess on her left buttocks that was opened on 07/02/2018.  Patient denies any problems.  On examination the packing has fallen out on his own.  Area appears to be healing well without any continued infection or cellulitis.  Patient was reassured and told to continue taking the antibiotic until completely finished.  ____________________________________________   FINAL CLINICAL IMPRESSION(S) / ED DIAGNOSES  Final diagnoses:  Wound check, abscess     ED Discharge Orders    None       Note:  This document was prepared using Dragon voice recognition software and may include unintentional dictation errors.    [name], PA-C 07/04/18 [place], [place], MD 07/08/18 [phone]

## 2018-07-04 NOTE — Discharge Instructions (Signed)
Follow[facility] with your primary care provider or [facility] if any continued problems.  Continue antibiotic until completely finished.  You may also use warm compresses to the area as needed for discomfort or drainage.

## 2018-07-04 NOTE — ED Notes (Signed)
See triage note  States she was seen 2 days ago and had abscess lanced  Here for wound check

## 2018-08-11 ENCOUNTER — Encounter: Payer: Self-pay | Admitting: Emergency Medicine

## 2018-08-11 ENCOUNTER — Emergency Department
Admission: EM | Admit: 2018-08-11 | Discharge: 2018-08-11 | Disposition: A | Discharge status: Home / Self Care | Payer: Medicaid Other | Attending: Student in an Organized Health Care Education/Training Program | Admitting: Student in an Organized Health Care Education/Training Program

## 2018-08-11 ENCOUNTER — Emergency Department: Payer: Medicaid Other

## 2018-08-11 DIAGNOSIS — R079 Chest pain, unspecified: Secondary | ICD-10-CM

## 2018-08-11 DIAGNOSIS — F17210 Nicotine dependence, cigarettes, uncomplicated: Secondary | ICD-10-CM | POA: Insufficient documentation

## 2018-08-11 DIAGNOSIS — Z79899 Other long term (current) drug therapy: Secondary | ICD-10-CM | POA: Insufficient documentation

## 2018-08-11 LAB — BASIC METABOLIC PANEL
Anion gap: 7 (ref 5–15)
BUN: 12 mg/dL (ref 6–20)
CO2: 24 mmol/L (ref 22–32)
CREATININE: 0.79 mg/dL (ref 0.44–1.00)
Calcium: 8.8 mg/dL — ABNORMAL LOW (ref 8.9–10.3)
Chloride: 107 mmol/L (ref 98–111)
GFR calc Af Amer: >60 mL/min (ref >60)
GLUCOSE: 92 mg/dL (ref 70–99)
Potassium: 3.5 mmol/L (ref 3.5–5.1)
SODIUM: 138 mmol/L (ref 135–145)

## 2018-08-11 LAB — CBC
HCT: 36.2 % (ref 36.0–46.0)
Hemoglobin: 11.6 g/dL — ABNORMAL LOW (ref 12.0–15.0)
MCH: 29.1 pg (ref 26.0–34.0)
MCHC: 32 g/dL (ref 30.0–36.0)
MCV: 90.7 fL (ref 80.0–100.0)
Platelets: 328 10*3/uL (ref 150–400)
RBC: 3.99 MIL/uL (ref 3.87–5.11)
RDW: 14 % (ref 11.5–15.5)
WBC: 5.7 10*3/uL (ref 4.0–10.5)
nRBC: 0 % (ref 0.0–0.2)

## 2018-08-11 LAB — TROPONIN I

## 2018-08-11 MED ORDER — ALPRAZOLAM 0.5 MG PO TABS: 0.5000 mg | ORAL_TABLET | Freq: Every evening | ORAL | 0 refills | Status: AC | PRN

## 2018-08-11 NOTE — ED Triage Notes (Signed)
Patient reports feeling tired, chest aching radiating down left arm and night chills x 4 days.  Patient reports some shortness of breath.  Patient states, "it feels like I have an elephant sitting on my chest."  Patient denies cocaine use.

## 2018-08-11 NOTE — ED Notes (Signed)
Pt c/ chest pain located on left side radiating down left arm x4 days - pt reports that she is having trouble breathing but O2 sat 100% after ambulating from waiting room to exam room - she states"my body feels sick"

## 2018-08-11 NOTE — ED Provider Notes (Signed)
[facility] Emergency Department Provider Note    First MD Initiated Contact with Patient 08/11/18 1643     (approximate)  I have reviewed the triage vital signs and the nursing notes.   HISTORY  Chief Complaint Generalized Body Aches; Chills; and Chest Pain    HPI Abigail [name] is a 28 y.o. female with below listed past medical history presents the ER with left-sided intermittent chest discomfort worsened with movement and lying in bed.  States she is also been feeling fatigued and short of breath.  Denies any lower extremity swelling.  Is had pain like this previously.  She is currently pain-free describes it more as a pressure.  She tried an albuterol nebulizer at home without improvement.  Does not feel like she is wheezing or having any cough.  No recent fevers.  States that she has been feeling anxious and discussed this with her sister.    Past Medical History:  Diagnosis Date  . BRCA negative 08/2016  . Endometriosis   . Ovarian cyst    Family History  Problem Relation Age of Onset  . Hypertension Mother   . Diabetes Mother   . Cancer Mother        Ovarian  . Hypertension Maternal Grandmother    Past Surgical History:  Procedure Laterality Date  . IUD REMOVAL    . laproscopy w/ attempt to remove L cyst but cyst was no longer present Left 2015  . TUBAL LIGATION  06/21/2015   Procedure: POST PARTUM TUBAL LIGATION;  Surgeon: Honor Loh Ward, MD;  Location: [facility] ORS;  Service: Gynecology;;   Patient Active Problem List   Diagnosis Date Noted  . Vaginitis and vulvovaginitis 11/03/2016  . Labor and delivery, indication for care 06/20/2015  . Lumbago 06/18/2015  . Pelvic pain affecting pregnancy 05/28/2015  . Abdominal pain affecting pregnancy 04/12/2015      Prior to Admission medications   Medication Sig Start Date End Date Taking? Authorizing Provider  Albuterol Sulfate (PROAIR RESPICLICK) 500 (90 Base) MCG/ACT AEPB Inhale 2 puffs  into the lungs every 4 (four) hours as needed. 12/29/17   Paulette Blanch, MD  ALPRAZolam Duanne Moron) 0.5 MG tablet Take 1 tablet (0.5 mg total) by mouth at bedtime as needed for anxiety or sleep. 08/11/18 08/11/19  Merlyn Lot, MD  clobetasol cream (TEMOVATE) 3.70 % Apply 1 application topically 2 (two) times daily. 12/25/17   Malachy Mood, MD  clonazePAM (KLONOPIN) 0.5 MG tablet Take 0.5 mg by mouth 2 (two) times daily as needed for anxiety.    [provider]    Allergies Penicillins    Social History Social History   Tobacco Use  . Smoking status: Current Every Day Smoker    Packs/day: 0.50    Types: Cigarettes    Last attempt to quit: 02/18/2015    Years since quitting: 3.4  . Smokeless tobacco: Never Used  Substance Use Topics  . Alcohol use: No    Comment: occ  . Drug use: No    Review of Systems Patient denies headaches, rhinorrhea, blurry vision, numbness, shortness of breath, chest pain, edema, cough, abdominal pain, nausea, vomiting, diarrhea, dysuria, fevers, rashes or hallucinations unless otherwise stated above in HPI. ____________________________________________   PHYSICAL EXAM:  VITAL SIGNS: Vitals:   08/11/18 1526 08/11/18 1622  BP: (!) 160/105   Pulse: 71   Resp: 18   Temp: 98.1 F (36.7 C)   SpO2: 100% 100%    Constitutional: Alert and  oriented.  Eyes: Conjunctivae are normal.  Head: Atraumatic. Nose: No congestion/rhinnorhea. Mouth/Throat: Mucous membranes are moist.   Neck: No stridor. Painless ROM.  Cardiovascular: Normal rate, regular rhythm. Grossly normal heart sounds.  Good peripheral circulation. Respiratory: Normal respiratory effort.  No retractions. Lungs CTAB. Gastrointestinal: Soft and nontender. No distention. No abdominal bruits. No CVA tenderness. Genitourinary:  Musculoskeletal: No lower extremity tenderness nor edema.  No joint effusions. Neurologic:  Normal speech and language. No gross focal neurologic deficits are  appreciated. No facial droop Skin:  Skin is warm, dry and intact. No rash noted. Psychiatric: Mood and affect are normal. Speech and behavior are normal.  ____________________________________________   LABS (all labs ordered are listed, but only abnormal results are displayed)  Results for orders placed or performed during the hospital encounter of 08/11/18 (from the past 24 hour(s))  Basic metabolic panel     Status: Abnormal   Collection Time: 08/11/18  3:30 PM  Result Value Ref Range   Sodium 138 135 - 145 mmol/L   Potassium 3.5 3.5 - 5.1 mmol/L   Chloride 107 98 - 111 mmol/L   CO2 24 22 - 32 mmol/L   Glucose, Bld 92 70 - 99 mg/dL   BUN 12 6 - 20 mg/dL   Creatinine, Ser 0.79 0.44 - 1.00 mg/dL   Calcium 8.8 (L) 8.9 - 10.3 mg/dL   GFR calc non Af Amer >60 >60 mL/min   GFR calc Af Amer >60 >60 mL/min   Anion gap 7 5 - 15  CBC     Status: Abnormal   Collection Time: 08/11/18  3:30 PM  Result Value Ref Range   WBC 5.7 4.0 - 10.5 K/uL   RBC 3.99 3.87 - 5.11 MIL/uL   Hemoglobin 11.6 (L) 12.0 - 15.0 g/dL   HCT 36.2 36.0 - 46.0 %   MCV 90.7 80.0 - 100.0 fL   MCH 29.1 26.0 - 34.0 pg   MCHC 32.0 30.0 - 36.0 g/dL   RDW 14.0 11.5 - 15.5 %   Platelets 328 150 - 400 K/uL   nRBC 0.0 0.0 - 0.2 %  Troponin I - ONCE - STAT     Status: None   Collection Time: 08/11/18  3:30 PM  Result Value Ref Range   Troponin I <0.03 <0.03 ng/mL   ____________________________________________  EKG` My review and personal interpretation at Time: 15:32   Indication: chest pain  Rate: 60  Rhythm: sinus Axis: normal Other: , no stemi, normal intervals ____________________________________________  RADIOLOGY  I personally reviewed all radiographic images ordered to evaluate for the above acute complaints and reviewed radiology reports and findings.  These findings were personally discussed with the patient.  Please see medical record for radiology  report.  ____________________________________________   PROCEDURES  Procedure(s) performed:  Procedures    Critical Care performed: no ____________________________________________   INITIAL IMPRESSION / ASSESSMENT AND PLAN / ED COURSE  Pertinent labs & imaging results that were available during my care of the patient were reviewed by me and considered in my medical decision making (see chart for details).   DDX: Pleurisy pericarditis, ACS, PE, asthma, bronchitis, pneumonia, anxiety  Lauranne D Martinezgarcia is a 28 y.o. who presents to the ED with symptoms as described above.  She is afebrile hemodynamically stable.  No evidence of ACS.  She is low risk by Wells criteria and is PERC negative.  Chest x-ray shows no evidence of pneumothorax or pneumonia.  And possible pleurisy but symptoms are minimal.  Doubt pericarditis.  No evidence of asthma or bronchitis.  Will trial PRN Ativan due to likely component of anxiety.  Discussed signs and symptoms for which she should return to the ER.  Have discussed with the patient and available family all diagnostics and treatments performed thus far and all questions were answered to the best of my ability. The patient demonstrates understanding and agreement with plan.       As part of my medical decision making, I reviewed the following data within the [place] notes reviewed and incorporated, Labs reviewed, notes from prior ED visits and [place] Controlled Substance Database   ____________________________________________   FINAL CLINICAL IMPRESSION(S) / ED DIAGNOSES  Final diagnoses:  Chest pain, unspecified type      NEW MEDICATIONS STARTED DURING THIS VISIT:  New Prescriptions   ALPRAZOLAM (XANAX) 0.5 MG TABLET    Take 1 tablet (0.5 mg total) by mouth at bedtime as needed for anxiety or sleep.     Note:  This document was prepared using Dragon voice recognition software and may include unintentional dictation  errors.    Merlyn Lot, MD 08/11/18 [phone]

## 2018-12-14 ENCOUNTER — Emergency Department
Admission: EM | Admit: 2018-12-14 | Discharge: 2018-12-14 | Disposition: A | Discharge status: Home / Self Care | Payer: Self-pay | Attending: Emergency Medicine | Admitting: Emergency Medicine

## 2018-12-14 ENCOUNTER — Emergency Department: Payer: Self-pay

## 2018-12-14 ENCOUNTER — Other Ambulatory Visit: Payer: Self-pay

## 2018-12-14 DIAGNOSIS — R079 Chest pain, unspecified: Secondary | ICD-10-CM | POA: Insufficient documentation

## 2018-12-14 DIAGNOSIS — F419 Anxiety disorder, unspecified: Secondary | ICD-10-CM

## 2018-12-14 DIAGNOSIS — F17210 Nicotine dependence, cigarettes, uncomplicated: Secondary | ICD-10-CM | POA: Insufficient documentation

## 2018-12-14 LAB — BASIC METABOLIC PANEL
Anion gap: 7 (ref 5–15)
BUN: 11 mg/dL (ref 6–20)
CO2: 23 mmol/L (ref 22–32)
Calcium: 8.8 mg/dL — ABNORMAL LOW (ref 8.9–10.3)
Chloride: 108 mmol/L (ref 98–111)
Creatinine, Ser: 0.88 mg/dL (ref 0.44–1.00)
GFR calc Af Amer: >60 mL/min (ref >60)
GFR calc non Af Amer: >60 mL/min (ref >60)
Glucose, Bld: 104 mg/dL — ABNORMAL HIGH (ref 70–99)
Potassium: 3.8 mmol/L (ref 3.5–5.1)
Sodium: 138 mmol/L (ref 135–145)

## 2018-12-14 LAB — CBC
HCT: 39.1 % (ref 36.0–46.0)
Hemoglobin: 12.7 g/dL (ref 12.0–15.0)
MCH: 30 pg (ref 26.0–34.0)
MCHC: 32.5 g/dL (ref 30.0–36.0)
MCV: 92.4 fL (ref 80.0–100.0)
Platelets: 465 10*3/uL — ABNORMAL HIGH (ref 150–400)
RBC: 4.23 MIL/uL (ref 3.87–5.11)
RDW: 13.8 % (ref 11.5–15.5)
WBC: 9.9 10*3/uL (ref 4.0–10.5)
nRBC: 0 % (ref 0.0–0.2)

## 2018-12-14 LAB — TROPONIN I: Troponin I: <0.03 ng/mL (ref <0.03)

## 2018-12-14 LAB — POCT PREGNANCY, URINE: Preg Test, Ur: NEGATIVE

## 2018-12-14 MED ORDER — HYDROXYZINE HCL 25 MG PO TABS: 25.0000 mg | ORAL_TABLET | Freq: Three times a day (TID) | ORAL | 0 refills | Status: DC | PRN

## 2018-12-14 MED ORDER — SODIUM CHLORIDE 0.9% FLUSH: 3.0000 mL | Freq: Once | INTRAVENOUS | Status: DC

## 2018-12-14 NOTE — ED Triage Notes (Signed)
recurrent chest pain to center of chest X 2 weeks. Similar episode previously, and dx with anxiety. Pt states she cannot afford insurance and does not have a PCP. Pt alert and oriented X4, active, cooperative, pt in NAD. RR even and unlabored, color WNL.  Denies SOB.

## 2018-12-14 NOTE — ED Provider Notes (Signed)
[facility] Emergency Department Provider Note  Time seen: 8:04 PM  I have reviewed the triage vital signs and the nursing notes.   HISTORY  Chief Complaint Chest Pain   HPI Abigail [name] is a 28 y.o. female past medical history of anxiety presents to the emergency department for chest pain.  According to the patient for the past 2 weeks she has had intermittent pain in her central to left chest.  Patient states several months ago she was seen for the same and was told it was anxiety.  Patient is tearful throughout exam, states she has been under a lot of stress lately.  Patient states her mother just called her and she needs to leave the emergency department to go pick up her children.  Patient denies any fever cough congestion or shortness of breath.  Describes her chest pain as mild to moderate intermittent over the past 2 weeks mostly in the left chest.   Past Medical History:  Diagnosis Date  . BRCA negative 08/2016  . Endometriosis   . Ovarian cyst     Patient Active Problem List   Diagnosis Date Noted  . Vaginitis and vulvovaginitis 11/03/2016  . Labor and delivery, indication for care 06/20/2015  . Lumbago 06/18/2015  . Pelvic pain affecting pregnancy 05/28/2015  . Abdominal pain affecting pregnancy 04/12/2015    Past Surgical History:  Procedure Laterality Date  . IUD REMOVAL    . laproscopy w/ attempt to remove L cyst but cyst was no longer present Left 2015  . TUBAL LIGATION  06/21/2015   Procedure: POST PARTUM TUBAL LIGATION;  Surgeon: Honor Loh Ward, MD;  Location: [facility] ORS;  Service: Gynecology;;    Prior to Admission medications   Medication Sig Start Date End Date Taking? Authorizing Provider  Albuterol Sulfate (PROAIR RESPICLICK) 993 (90 Base) MCG/ACT AEPB Inhale 2 puffs into the lungs every 4 (four) hours as needed. 12/29/17   Paulette Blanch, MD  ALPRAZolam Duanne Moron) 0.5 MG tablet Take 1 tablet (0.5 mg total) by mouth at bedtime as  needed for anxiety or sleep. 08/11/18 08/11/19  Merlyn Lot, MD  clobetasol cream (TEMOVATE) 7.16 % Apply 1 application topically 2 (two) times daily. 12/25/17   Malachy Mood, MD  clonazePAM (KLONOPIN) 0.5 MG tablet Take 0.5 mg by mouth 2 (two) times daily as needed for anxiety.    [provider]    Allergies  Allergen Reactions  . Penicillins     Reaction unknown.      Family History  Problem Relation Age of Onset  . Hypertension Mother   . Diabetes Mother   . Cancer Mother        Ovarian  . Hypertension Maternal Grandmother     Social History Social History   Tobacco Use  . Smoking status: Current Every Day Smoker    Packs/day: 0.50    Types: Cigarettes    Last attempt to quit: 02/18/2015    Years since quitting: 3.8  . Smokeless tobacco: Never Used  Substance Use Topics  . Alcohol use: No    Comment: occ  . Drug use: No    Review of Systems Constitutional: Negative for fever. Cardiovascular: Intermittent left chest pain Respiratory: Negative for shortness of breath. Gastrointestinal: Negative for abdominal pain Musculoskeletal: Negative for musculoskeletal complaints Skin: Negative for skin complaints  Neurological: Negative for headache All other ROS negative  ____________________________________________   PHYSICAL EXAM:  VITAL SIGNS: ED Triage Vitals [12/14/18 1657]  Enc Vitals  Group     BP 129/76     Pulse Rate 84     Resp 18     Temp 98.7 F (37.1 C)     Temp Source Oral     SpO2 99 %     Weight 203 lb (92.1 kg)     Height _0  (1.651 m)     Head Circumference      Peak Flow      Pain Score 7     Pain Loc      Pain Edu?      Excl. in [place]?     Constitutional: Alert and oriented. Well appearing and in no distress. Eyes: Normal exam ENT      Head: Normocephalic and atraumatic.      Mouth/Throat: Mucous membranes are moist. Cardiovascular: Normal rate, regular rhythm.  Respiratory: Normal respiratory effort without  tachypnea nor retractions. Breath sounds are clear  Gastrointestinal: Soft and nontender. No distention.   Musculoskeletal: Nontender with normal range of motion in all extremities.  Chest wall is nontender. Neurologic:  Normal speech and language. No gross focal neurologic deficits  Skin:  Skin is warm, dry and intact.  Psychiatric: Mood and affect are normal.  ____________________________________________    EKG  EKG viewed and interpreted by myself shows a normal sinus rhythm 82 bpm with a narrow QRS, normal axis, normal intervals, no ST changes.  Reassuring EKG.  ____________________________________________   INITIAL IMPRESSION / ASSESSMENT AND PLAN / ED COURSE  Pertinent labs & imaging results that were available during my care of the patient were reviewed by me and considered in my medical decision making (see chart for details).   Patient presents emergency department for intermittent chest pain in the left chest over the past 2 weeks.  Patient states she has been seen for the same in the past and was told it was likely anxiety.  Never filled her medications for anxiety.  Patient states she does not currently have insurance and does not have a PCP to follow-up with.  Patient is tearful during my exam, states her mother just called her and she needs to leave to go pick up her children.  We did not get a chest x-ray yet however patient has no shortness of breath, clear lung sounds.  Normal heart sounds.  Normal work-up including negative troponin and a reassuring EKG.  I discussed with the patient the high possibility of this being stress/anxiety related, however I discussed she needs to follow-up with her primary care doctor and I will refer her to several I could work with her as far as Solicitor.  We will also discharge her with a short course of hydroxyzine.  Discussed my normal chest pain return precautions.  Abigail [name] was evaluated in Emergency Department on 12/14/2018  for the symptoms described in the history of present illness. She was evaluated in the context of the global COVID-19 pandemic, which necessitated consideration that the patient might be at risk for infection with the SARS-CoV-2 virus that causes COVID-19. Institutional protocols and algorithms that pertain to the evaluation of patients at risk for COVID-19 are in a state of rapid change based on information released by regulatory bodies including the CDC and federal and state organizations. These policies and algorithms were followed during the patient's care in the ED.  ____________________________________________   FINAL CLINICAL IMPRESSION(S) / ED DIAGNOSES  Chest pain   Harvest Dark, MD 12/14/18 2006

## 2019-02-03 ENCOUNTER — Emergency Department
Admission: EM | Admit: 2019-02-03 | Discharge: 2019-02-03 | Disposition: A | Discharge status: Home / Self Care | Payer: Self-pay | Attending: Emergency Medicine | Admitting: Emergency Medicine

## 2019-02-03 ENCOUNTER — Other Ambulatory Visit: Payer: Self-pay

## 2019-02-03 ENCOUNTER — Emergency Department: Payer: Self-pay

## 2019-02-03 ENCOUNTER — Encounter: Payer: Self-pay | Admitting: Medical Oncology

## 2019-02-03 DIAGNOSIS — F17210 Nicotine dependence, cigarettes, uncomplicated: Secondary | ICD-10-CM | POA: Insufficient documentation

## 2019-02-03 DIAGNOSIS — Z79899 Other long term (current) drug therapy: Secondary | ICD-10-CM | POA: Insufficient documentation

## 2019-02-03 DIAGNOSIS — R600 Localized edema: Secondary | ICD-10-CM | POA: Insufficient documentation

## 2019-02-03 LAB — BASIC METABOLIC PANEL
Anion gap: 8 (ref 5–15)
BUN: 12 mg/dL (ref 6–20)
CO2: 21 mmol/L — ABNORMAL LOW (ref 22–32)
Calcium: 8.8 mg/dL — ABNORMAL LOW (ref 8.9–10.3)
Chloride: 107 mmol/L (ref 98–111)
Creatinine, Ser: 0.74 mg/dL (ref 0.44–1.00)
GFR calc Af Amer: >60 mL/min (ref >60)
GFR calc non Af Amer: >60 mL/min (ref >60)
Glucose, Bld: 96 mg/dL (ref 70–99)
Potassium: 4 mmol/L (ref 3.5–5.1)
Sodium: 136 mmol/L (ref 135–145)

## 2019-02-03 LAB — CBC WITH DIFFERENTIAL/PLATELET
Abs Immature Granulocytes: 0.04 10*3/uL (ref 0.00–0.07)
Basophils Absolute: 0.1 10*3/uL (ref 0.0–0.1)
Basophils Relative: 1 %
Eosinophils Absolute: 0.1 10*3/uL (ref 0.0–0.5)
Eosinophils Relative: 1 %
HCT: 38.3 % (ref 36.0–46.0)
Hemoglobin: 12.6 g/dL (ref 12.0–15.0)
Immature Granulocytes: 1 %
Lymphocytes Relative: 35 %
Lymphs Abs: 2.8 10*3/uL (ref 0.7–4.0)
MCH: 30.1 pg (ref 26.0–34.0)
MCHC: 32.9 g/dL (ref 30.0–36.0)
MCV: 91.6 fL (ref 80.0–100.0)
Monocytes Absolute: 0.5 10*3/uL (ref 0.1–1.0)
Monocytes Relative: 6 %
Neutro Abs: 4.5 10*3/uL (ref 1.7–7.7)
Neutrophils Relative %: 56 %
Platelets: 416 10*3/uL — ABNORMAL HIGH (ref 150–400)
RBC: 4.18 MIL/uL (ref 3.87–5.11)
RDW: 13.4 % (ref 11.5–15.5)
WBC: 8 10*3/uL (ref 4.0–10.5)
nRBC: 0 % (ref 0.0–0.2)

## 2019-02-03 LAB — URIC ACID: Uric Acid, Serum: 4.7 mg/dL (ref 2.5–7.1)

## 2019-02-03 LAB — SEDIMENTATION RATE: Sed Rate: 15 mm/hr (ref 0–20)

## 2019-02-03 MED ORDER — FUROSEMIDE 20 MG PO TABS: 20.0000 mg | ORAL_TABLET | Freq: Every day | ORAL | 11 refills | Status: DC

## 2019-02-03 NOTE — ED Triage Notes (Signed)
Pt reports for the past 8 weeks she has been having rt ankle/bottom foot pain.

## 2019-02-03 NOTE — ED Provider Notes (Addendum)
[facility] Emergency Department Provider Note   ____________________________________________   First MD Initiated Contact with Patient 02/03/19 1012     (approximate)  I have reviewed the triage vital signs and the nursing notes.   HISTORY  Chief Complaint Foot Pain and Ankle Pain    HPI Abigail [name] is a 28 y.o. female patient presents with 2 months of pain and edema that waxes and wanes of the right foot.  Patient denies provoking incident for complaint.  Patient did pain increased with weightbearing.  Patient rates pain as a 10/10.  Patient scribed pain is "achy".  No palliative measure for complaint.         Past Medical History:  Diagnosis Date   BRCA negative 08/2016   Endometriosis    Ovarian cyst     Patient Active Problem List   Diagnosis Date Noted   Vaginitis and vulvovaginitis 11/03/2016   Labor and delivery, indication for care 06/20/2015   Lumbago 06/18/2015   Pelvic pain affecting pregnancy 05/28/2015   Abdominal pain affecting pregnancy 04/12/2015    Past Surgical History:  Procedure Laterality Date   IUD REMOVAL     laproscopy w/ attempt to remove L cyst but cyst was no longer present Left 2015   TUBAL LIGATION  06/21/2015   Procedure: POST PARTUM TUBAL LIGATION;  Surgeon: Honor Loh Ward, MD;  Location: [facility] ORS;  Service: Gynecology;;    Prior to Admission medications   Medication Sig Start Date End Date Taking? Authorizing Provider  Albuterol Sulfate (PROAIR RESPICLICK) 191 (90 Base) MCG/ACT AEPB Inhale 2 puffs into the lungs every 4 (four) hours as needed. 12/29/17   Paulette Blanch, MD  ALPRAZolam Duanne Moron) 0.5 MG tablet Take 1 tablet (0.5 mg total) by mouth at bedtime as needed for anxiety or sleep. 08/11/18 08/11/19  Merlyn Lot, MD  clonazePAM (KLONOPIN) 0.5 MG tablet Take 0.5 mg by mouth 2 (two) times daily as needed for anxiety.    [provider]  furosemide (LASIX) 20 MG tablet Take 1  tablet (20 mg total) by mouth daily. 02/03/19 02/03/20  Sable Feil, PA-C  hydrOXYzine (ATARAX/VISTARIL) 25 MG tablet Take 1 tablet (25 mg total) by mouth 3 (three) times daily as needed for anxiety. 12/14/18   Harvest Dark, MD    Allergies Penicillins  Family History  Problem Relation Age of Onset   Hypertension Mother    Diabetes Mother    Cancer Mother        Ovarian   Hypertension Maternal Grandmother     Social History Social History   Tobacco Use   Smoking status: Current Every Day Smoker    Packs/day: 0.50    Types: Cigarettes    Last attempt to quit: 02/18/2015    Years since quitting: 3.9   Smokeless tobacco: Never Used  Substance Use Topics   Alcohol use: No    Comment: occ   Drug use: No    Review of Systems  Constitutional: No fever/chills Eyes: No visual changes. ENT: No sore throat. Cardiovascular: Denies chest pain. Respiratory: Denies shortness of breath. Gastrointestinal: No abdominal pain.  No nausea, no vomiting.  No diarrhea.  No constipation. Genitourinary: Negative for dysuria. Musculoskeletal: Right foot pain.   Skin: Negative for rash. Neurological: Negative for headaches, focal weakness or numbness. Allergic/Immunilogical: Penicillin. ____________________________________________   PHYSICAL EXAM:  VITAL SIGNS: ED Triage Vitals  Enc Vitals Group     BP 02/03/19 0938 127/85     Pulse  Rate 02/03/19 0938 73     Resp 02/03/19 0938 18     Temp 02/03/19 0938 98.2 F (36.8 C)     Temp Source 02/03/19 0938 Oral     SpO2 02/03/19 0938 100 %     Weight 02/03/19 0940 235 lb (106.6 kg)     Height 02/03/19 0940 _0  (1.702 m)     Head Circumference --      Peak Flow --      Pain Score 02/03/19 0940 7     Pain Loc --      Pain Edu? --      Excl. in [place]? --    Constitutional: Alert and oriented. Well appearing and in no acute distress. Cardiovascular: Normal rate, regular rhythm. Grossly normal heart sounds.  Good peripheral  circulation. Respiratory: Normal respiratory effort.  No retractions. Lungs CTAB. Musculoskeletal: Right foot edema.  No deformity.  Neurologic:  Normal speech and language. No gross focal neurologic deficits are appreciated. No gait instability. Skin:  Skin is warm, dry and intact. No rash noted. Psychiatric: Mood and affect are normal. Speech and behavior are normal.  ____________________________________________   LABS (all labs ordered are listed, but only abnormal results are displayed)  Labs Reviewed  BASIC METABOLIC PANEL - Abnormal; Notable for the following components:      Result Value   CO2 21 (*)    Calcium 8.8 (*)    All other components within normal limits  CBC WITH DIFFERENTIAL/PLATELET - Abnormal; Notable for the following components:   Platelets 416 (*)    All other components within normal limits  URIC ACID  SEDIMENTATION RATE   ____________________________________________  EKG   ____________________________________________  RADIOLOGY  ED MD interpretation:    Official radiology report(s): US Venous Img Lower Unilateral Right  Result Date: 02/03/2019 CLINICAL DATA:  28 year old female with right lower extremity pain and swelling EXAM: RIGHT LOWER EXTREMITY VENOUS DOPPLER ULTRASOUND TECHNIQUE: Gray-scale sonography with graded compression, as well as color Doppler and duplex ultrasound were performed to evaluate the lower extremity deep venous systems from the level of the common femoral vein and including the common femoral, femoral, profunda femoral, popliteal and calf veins including the posterior tibial, peroneal and gastrocnemius veins when visible. The superficial great saphenous vein was also interrogated. Spectral Doppler was utilized to evaluate flow at rest and with distal augmentation maneuvers in the common femoral, femoral and popliteal veins. COMPARISON:  None. FINDINGS: Contralateral Common Femoral Vein: Respiratory phasicity is normal and  symmetric with the symptomatic side. No evidence of thrombus. Normal compressibility. Common Femoral Vein: No evidence of thrombus. Normal compressibility, respiratory phasicity and response to augmentation. Saphenofemoral Junction: No evidence of thrombus. Normal compressibility and flow on color Doppler imaging. Profunda Femoral Vein: No evidence of thrombus. Normal compressibility and flow on color Doppler imaging. Femoral Vein: No evidence of thrombus. Normal compressibility, respiratory phasicity and response to augmentation. Popliteal Vein: No evidence of thrombus. Normal compressibility, respiratory phasicity and response to augmentation. Calf Veins: No evidence of thrombus. Normal compressibility and flow on color Doppler imaging. Superficial Great Saphenous Vein: No evidence of thrombus. Normal compressibility. Venous Reflux:  None. Other Findings:  None. IMPRESSION: No evidence of deep venous thrombosis. Electronically Signed   By: Jacqulynn Cadet M.D.   On: 02/03/2019 13:48   Dg Foot 2 Views Right  Result Date: 02/03/2019 CLINICAL DATA:  Right foot pain and swelling. EXAM: RIGHT FOOT - 2 VIEW COMPARISON:  None. FINDINGS: There is no evidence of  fracture or dislocation. There is no evidence of arthropathy or other focal bone abnormality. Midfoot dorsal soft tissue swelling. IMPRESSION: 1. No acute fracture or dislocation identified about the right foot. 2. Midfoot dorsal soft tissue swelling. Electronically Signed   By: Fidela Salisbury M.D.   On: 02/03/2019 10:45    ____________________________________________   PROCEDURES  Procedure(s) performed (including Critical Care):  Procedures   ____________________________________________   INITIAL IMPRESSION / ASSESSMENT AND PLAN / ED COURSE  As part of my medical decision making, I reviewed the following data within the [place] was evaluated in Emergency Department on 02/03/2019 for the  symptoms described in the history of present illness. She was evaluated in the context of the global COVID-19 pandemic, which necessitated consideration that the patient might be at risk for infection with the SARS-CoV-2 virus that causes COVID-19. Institutional protocols and algorithms that pertain to the evaluation of patients at risk for COVID-19 are in a state of rapid change based on information released by regulatory bodies including the CDC and federal and state organizations. These policies and algorithms were followed during the patient's care in the ED.  Patient presents with 2 months of pain and edema to the right lower extremity to include the foot.  Differentials consist of fluid retention, gout, or DVT.  Patient will be further evaluated with labs, x-ray, and ultrasound if warranted.     ____________________________________________   FINAL CLINICAL IMPRESSION(S) / ED DIAGNOSES  Final diagnoses:  Mild peripheral edema     ED Discharge Orders         Ordered    furosemide (LASIX) 20 MG tablet  Daily     02/03/19 1359           Note:  This document was prepared using Dragon voice recognition software and may include unintentional dictation errors.    Sable Feil, PA-C 02/03/19 1143    Sable Feil, PA-C 02/03/19 1405    Delman Kitten, MD 03/04/19 1028

## 2019-02-03 NOTE — Discharge Instructions (Addendum)
Wear compression wrap while awake.  Measure foot edema daily.  Ambulate with crutches for 3 to 5 days as needed.  Follow-up with the [facility].  Return to ED if you develop any shortness of breath or chest pain.

## 2019-02-03 NOTE — ED Notes (Signed)
See triage note  presents with swelling to right foot  Denies any injury  States sx's started about 8 weeks ago  Positive swelling noted on arrival

## 2019-02-03 NOTE — ED Notes (Signed)
Ace bandage appleid to right foot/ankle.  Crutches with instructions.

## 2019-03-09 ENCOUNTER — Encounter: Payer: Self-pay | Admitting: Obstetrics and Gynecology

## 2019-03-09 ENCOUNTER — Ambulatory Visit (INDEPENDENT_AMBULATORY_CARE_PROVIDER_SITE_OTHER): Payer: Self-pay | Admitting: Obstetrics and Gynecology

## 2019-03-09 ENCOUNTER — Other Ambulatory Visit: Payer: Self-pay

## 2019-03-09 VITALS — BP 126/83 | HR 76 | Ht 66.0 in | Wt 240.0 lb

## 2019-03-09 DIAGNOSIS — N760 Acute vaginitis: Secondary | ICD-10-CM

## 2019-03-09 NOTE — Progress Notes (Signed)
Obstetrics & Gynecology Office Visit   Chief Complaint  Patient presents with  . Vaginal Discharge    odor   History of Present Illness: [name] is a 28 y.o. [id number] who LMP was Patient's last menstrual period was 02/25/2019 (exact date)., presents today for a problem visit.  She presents with some symptoms suggestive of pregnancy. Her discharge has an odor.  Her discharge is creamy with occasional blood tint. The onset of these symptoms was last week.  She has not itching on the inside and outside.  She denies burning sensation.  She denies any sores.  She has tried no treatments for her current symptoms.  She states that she does not believe she has an STD, but would like to be tested.  She denies fevers, chills, abdominal pain.    Past Medical History:  Diagnosis Date  . BRCA negative 08/2016  . Endometriosis   . Ovarian cyst     Past Surgical History:  Procedure Laterality Date  . IUD REMOVAL    . laproscopy w/ attempt to remove L cyst but cyst was no longer present Left 2015  . TUBAL LIGATION  06/21/2015   Procedure: POST PARTUM TUBAL LIGATION;  Surgeon: Honor Loh Ward, MD;  Location: [facility] ORS;  Service: Gynecology;;    Gynecologic History: Patient's last menstrual period was 02/25/2019 (exact date).  Obstetric History: [id number]  Family History  Problem Relation Age of Onset  . Hypertension Mother   . Diabetes Mother   . Cancer Mother        Ovarian  . Hypertension Maternal Grandmother     Social History   Socioeconomic History  . Marital status: Single    Spouse name: Not on file  . Number of children: Not on file  . Years of education: Not on file  . Highest education level: Not on file  Occupational History  . Not on file  Social Needs  . Financial resource strain: Not on file  . Food insecurity    Worry: Not on file    Inability: Not on file  . Transportation needs    Medical: Not on file    Non-medical: Not on file  Tobacco Use  .  Smoking status: Current Every Day Smoker    Packs/day: 0.50    Types: Cigarettes    Last attempt to quit: 02/18/2015    Years since quitting: 4.0  . Smokeless tobacco: Never Used  Substance and Sexual Activity  . Alcohol use: No    Comment: occ  . Drug use: No  . Sexual activity: Yes    Birth control/protection: None  Lifestyle  . Physical activity    Days per week: Not on file    Minutes per session: Not on file  . Stress: Not on file  Relationships  . Social Herbalist on phone: Not on file    Gets together: Not on file    Attends religious service: Not on file    Active member of club or organization: Not on file    Attends meetings of clubs or organizations: Not on file    Relationship status: Not on file  . Intimate partner violence    Fear of current or ex partner: Not on file    Emotionally abused: Not on file    Physically abused: Not on file    Forced sexual activity: Not on file  Other Topics Concern  . Not on file  Social History Narrative  .  Not on file    Allergies  Allergen Reactions  . Penicillins     Reaction unknown.      Prior to Admission medications   Medication Sig Start Date End Date Taking? Authorizing Provider  Albuterol Sulfate (PROAIR RESPICLICK) 841 (90 Base) MCG/ACT AEPB Inhale 2 puffs into the lungs every 4 (four) hours as needed. 12/29/17  Yes Paulette Blanch, MD  ALPRAZolam Duanne Moron) 0.5 MG tablet Take 1 tablet (0.5 mg total) by mouth at bedtime as needed for anxiety or sleep. 08/11/18 08/11/19 Yes Merlyn Lot, MD  clonazePAM (KLONOPIN) 0.5 MG tablet Take 0.5 mg by mouth 2 (two) times daily as needed for anxiety.   Yes [provider]  furosemide (LASIX) 20 MG tablet Take 1 tablet (20 mg total) by mouth daily. 02/03/19 02/03/20 Yes Sable Feil, PA-C  hydrOXYzine (ATARAX/VISTARIL) 25 MG tablet Take 1 tablet (25 mg total) by mouth 3 (three) times daily as needed for anxiety. 12/14/18  Yes Harvest Dark, MD    Review  of Systems  Constitutional: Negative.   HENT: Negative.   Eyes: Negative.   Respiratory: Negative.   Cardiovascular: Negative.   Gastrointestinal: Negative.   Genitourinary: Negative.        See HPI  Musculoskeletal: Negative.   Skin: Negative.   Neurological: Negative.   Psychiatric/Behavioral: Negative.      Physical Exam BP 126/83 (BP Location: Left Arm, Patient Position: Sitting, Cuff Size: Large)   Pulse 76   Ht _0  (1.676 m)   Wt 240 lb (108.9 kg)   LMP 02/25/2019 (Exact Date)   BMI 38.74 kg/m  Patient's last menstrual period was 02/25/2019 (exact date). Physical Exam Constitutional:      General: She is not in acute distress.    Appearance: Normal appearance. She is well-developed.  Genitourinary:     Pelvic exam was performed with patient in the lithotomy position.     Vulva, inguinal canal, urethra, bladder, vagina, uterus, right adnexa and left adnexa normal.     No posterior fourchette tenderness, injury or lesion present.     No cervical friability, lesion, bleeding or polyp.  HENT:     Head: Normocephalic and atraumatic.  Eyes:     General: No scleral icterus.    Conjunctiva/sclera: Conjunctivae normal.  Neck:     Musculoskeletal: Normal range of motion and neck supple.  Cardiovascular:     Rate and Rhythm: Normal rate and regular rhythm.     Heart sounds: No murmur. No friction rub. No gallop.   Pulmonary:     Effort: Pulmonary effort is normal. No respiratory distress.     Breath sounds: Normal breath sounds. No wheezing or rales.  Abdominal:     General: Bowel sounds are normal. There is no distension.     Palpations: Abdomen is soft. There is no mass.     Tenderness: There is no abdominal tenderness. There is no guarding or rebound.  Musculoskeletal: Normal range of motion.  Neurological:     General: No focal deficit present.     Mental Status: She is alert and oriented to person, place, and time.     Cranial Nerves: No cranial nerve deficit.   Skin:    General: Skin is warm and dry.     Findings: No erythema.  Psychiatric:        Mood and Affect: Mood normal.        Behavior: Behavior normal.        Judgment: Judgment normal.  Female chaperone present for pelvic and breast  portions of the physical exam  Assessment: 28 y.o. [id number] female here for  1. Acute vaginitis      Plan: Problem List Items Addressed This Visit    None    Visit Diagnoses    Acute vaginitis    -  Primary   Relevant Orders   NuSwab Vaginitis Plus (VG+)     NuSwab for vaginitis and STDs. Will follow up accordingly.  15 minutes spent in face to face discussion with > 50% spent in counseling,management, and coordination of care of her acute vaginitis.   Prentice Docker, MD 03/09/2019 4:46 PM

## 2019-03-13 LAB — NUSWAB VAGINITIS PLUS (VG+)
Atopobium vaginae: HIGH Score — AB
Candida albicans, NAA: NEGATIVE
Candida glabrata, NAA: NEGATIVE
Chlamydia trachomatis, NAA: NEGATIVE
Megasphaera 1: HIGH Score — AB
Neisseria gonorrhoeae, NAA: NEGATIVE
Trich vag by NAA: NEGATIVE

## 2019-03-15 ENCOUNTER — Telehealth: Payer: Self-pay | Admitting: Obstetrics and Gynecology

## 2019-03-15 NOTE — Telephone Encounter (Signed)
Patient is calling for labs results. Please advise. Ok to send message thru [facility]. Thank you!

## 2019-03-17 ENCOUNTER — Other Ambulatory Visit: Payer: Self-pay | Admitting: Obstetrics and Gynecology

## 2019-03-17 DIAGNOSIS — B9689 Other specified bacterial agents as the cause of diseases classified elsewhere: Secondary | ICD-10-CM

## 2019-03-17 MED ORDER — METRONIDAZOLE 500 MG PO TABS: 500.0000 mg | ORAL_TABLET | Freq: Two times a day (BID) | ORAL | 0 refills | Status: AC

## 2019-03-17 NOTE — Telephone Encounter (Signed)
I just sent message through [place], if she calls again.  Thanks

## 2019-04-28 ENCOUNTER — Emergency Department: Payer: Medicaid Other

## 2019-04-28 ENCOUNTER — Encounter: Payer: Self-pay | Admitting: Emergency Medicine

## 2019-04-28 ENCOUNTER — Other Ambulatory Visit: Payer: Self-pay

## 2019-04-28 ENCOUNTER — Emergency Department
Admission: EM | Admit: 2019-04-28 | Discharge: 2019-04-28 | Disposition: A | Discharge status: Home / Self Care | Payer: Medicaid Other | Attending: Emergency Medicine | Admitting: Emergency Medicine

## 2019-04-28 DIAGNOSIS — Y939 Activity, unspecified: Secondary | ICD-10-CM | POA: Insufficient documentation

## 2019-04-28 DIAGNOSIS — X58XXXA Exposure to other specified factors, initial encounter: Secondary | ICD-10-CM | POA: Insufficient documentation

## 2019-04-28 DIAGNOSIS — S62629A Displaced fracture of medial phalanx of unspecified finger, initial encounter for closed fracture: Secondary | ICD-10-CM

## 2019-04-28 DIAGNOSIS — Y999 Unspecified external cause status: Secondary | ICD-10-CM | POA: Insufficient documentation

## 2019-04-28 DIAGNOSIS — Y929 Unspecified place or not applicable: Secondary | ICD-10-CM | POA: Insufficient documentation

## 2019-04-28 DIAGNOSIS — F17210 Nicotine dependence, cigarettes, uncomplicated: Secondary | ICD-10-CM | POA: Insufficient documentation

## 2019-04-28 DIAGNOSIS — S62650A Nondisplaced fracture of medial phalanx of right index finger, initial encounter for closed fracture: Secondary | ICD-10-CM | POA: Insufficient documentation

## 2019-04-28 LAB — CBC WITH DIFFERENTIAL/PLATELET
Abs Immature Granulocytes: 0.05 10*3/uL (ref 0.00–0.07)
Basophils Absolute: 0.1 10*3/uL (ref 0.0–0.1)
Basophils Relative: 1 %
Eosinophils Absolute: 0.1 10*3/uL (ref 0.0–0.5)
Eosinophils Relative: 2 %
HCT: 38.1 % (ref 36.0–46.0)
Hemoglobin: 12.3 g/dL (ref 12.0–15.0)
Immature Granulocytes: 1 %
Lymphocytes Relative: 30 %
Lymphs Abs: 2.8 10*3/uL (ref 0.7–4.0)
MCH: 29.4 pg (ref 26.0–34.0)
MCHC: 32.3 g/dL (ref 30.0–36.0)
MCV: 90.9 fL (ref 80.0–100.0)
Monocytes Absolute: 0.7 10*3/uL (ref 0.1–1.0)
Monocytes Relative: 8 %
Neutro Abs: 5.7 10*3/uL (ref 1.7–7.7)
Neutrophils Relative %: 58 %
Platelets: 430 10*3/uL — ABNORMAL HIGH (ref 150–400)
RBC: 4.19 MIL/uL (ref 3.87–5.11)
RDW: 13.9 % (ref 11.5–15.5)
WBC: 9.4 10*3/uL (ref 4.0–10.5)
nRBC: 0 % (ref 0.0–0.2)

## 2019-04-28 LAB — BASIC METABOLIC PANEL
Anion gap: 7 (ref 5–15)
BUN: 11 mg/dL (ref 6–20)
CO2: 24 mmol/L (ref 22–32)
Calcium: 8.9 mg/dL (ref 8.9–10.3)
Chloride: 110 mmol/L (ref 98–111)
Creatinine, Ser: 0.79 mg/dL (ref 0.44–1.00)
GFR calc Af Amer: >60 mL/min (ref >60)
GFR calc non Af Amer: >60 mL/min (ref >60)
Glucose, Bld: 89 mg/dL (ref 70–99)
Potassium: 4.1 mmol/L (ref 3.5–5.1)
Sodium: 141 mmol/L (ref 135–145)

## 2019-04-28 LAB — SEDIMENTATION RATE: Sed Rate: 27 mm/hr — ABNORMAL HIGH (ref 0–20)

## 2019-04-28 LAB — URIC ACID: Uric Acid, Serum: 5 mg/dL (ref 2.5–7.1)

## 2019-04-28 MED ORDER — NAPROXEN 500 MG PO TABS: 500.0000 mg | ORAL_TABLET | Freq: Two times a day (BID) | ORAL | Status: DC

## 2019-04-28 NOTE — ED Provider Notes (Signed)
[facility] Emergency Department Provider Note   ____________________________________________   First MD Initiated Contact with Patient 04/28/19 0901     (approximate)  I have reviewed the triage vital signs and the nursing notes.   HISTORY  Chief Complaint Hand Pain    HPI Abigail [name] is a 28 y.o. female patient complain of 2 days of right index finger pain and decreased range of motion with flexion.  Patient denies provocative incident for complaint.  Patient is right-hand dominant.  Patient rates pain as 8/10.  Patient described the pain as "achy".  No palliative measure for complaint.         Past Medical History:  Diagnosis Date  . BRCA negative 08/2016  . Endometriosis   . Ovarian cyst     Patient Active Problem List   Diagnosis Date Noted  . Vaginitis and vulvovaginitis 11/03/2016  . Labor and delivery, indication for care 06/20/2015  . Lumbago 06/18/2015  . Pelvic pain affecting pregnancy 05/28/2015  . Abdominal pain affecting pregnancy 04/12/2015    Past Surgical History:  Procedure Laterality Date  . IUD REMOVAL    . laproscopy w/ attempt to remove L cyst but cyst was no longer present Left 2015  . TUBAL LIGATION  06/21/2015   Procedure: POST PARTUM TUBAL LIGATION;  Surgeon: Honor Loh Ward, MD;  Location: [facility] ORS;  Service: Gynecology;;    Prior to Admission medications   Medication Sig Start Date End Date Taking? Authorizing Provider  Albuterol Sulfate (PROAIR RESPICLICK) 001 (90 Base) MCG/ACT AEPB Inhale 2 puffs into the lungs every 4 (four) hours as needed. 12/29/17   Paulette Blanch, MD  ALPRAZolam Duanne Moron) 0.5 MG tablet Take 1 tablet (0.5 mg total) by mouth at bedtime as needed for anxiety or sleep. 08/11/18 08/11/19  Merlyn Lot, MD  clonazePAM (KLONOPIN) 0.5 MG tablet Take 0.5 mg by mouth 2 (two) times daily as needed for anxiety.    [provider]  furosemide (LASIX) 20 MG tablet Take 1 tablet (20 mg  total) by mouth daily. 02/03/19 02/03/20  Sable Feil, PA-C  hydrOXYzine (ATARAX/VISTARIL) 25 MG tablet Take 1 tablet (25 mg total) by mouth 3 (three) times daily as needed for anxiety. 12/14/18   Harvest Dark, MD  naproxen (NAPROSYN) 500 MG tablet Take 1 tablet (500 mg total) by mouth 2 (two) times daily with a meal. 04/28/19   Sable Feil, PA-C    Allergies Penicillins  Family History  Problem Relation Age of Onset  . Hypertension Mother   . Diabetes Mother   . Cancer Mother        Ovarian  . Hypertension Maternal Grandmother     Social History Social History   Tobacco Use  . Smoking status: Current Every Day Smoker    Packs/day: 0.50    Types: Cigarettes    Last attempt to quit: 02/18/2015    Years since quitting: 4.1  . Smokeless tobacco: Never Used  Substance Use Topics  . Alcohol use: No    Comment: occ  . Drug use: No    Review of Systems  Constitutional: No fever/chills Eyes: No visual changes. ENT: No sore throat. Cardiovascular: Denies chest pain. Respiratory: Denies shortness of breath. Gastrointestinal: No abdominal pain.  No nausea, no vomiting.  No diarrhea.  No constipation. Genitourinary: Negative for dysuria. Musculoskeletal: Right index finger pain. Skin: Negative for rash.  Edema proximal phalanges second digit right hand. Neurological: Negative for headaches, focal weakness or numbness.  Allergic/Immunilogical: Penicillin ____________________________________________   PHYSICAL EXAM:  VITAL SIGNS: ED Triage Vitals  Enc Vitals Group     BP --      Pulse --      Resp --      Temp --      Temp src --      SpO2 --      Weight 04/28/19 0851 230 lb (104.3 kg)     Height 04/28/19 0851 5' 5" (1.651 m)     Head Circumference --      Peak Flow --      Pain Score 04/28/19 0850 8     Pain Loc --      Pain Edu? --      Excl. in [place]? --     Constitutional: Alert and oriented. Well appearing and in no acute distress. Cardiovascular:  Normal rate, regular rhythm. Grossly normal heart sounds.  Good peripheral circulation. Respiratory: Normal respiratory effort.  No retractions. Lungs CTAB. Musculoskeletal: No obvious deformity to the right index finger moderate edema.  Decreased range of motion with flexion of the MPJ.   Neurologic:  Normal speech and language. No gross focal neurologic deficits are appreciated. No gait instability. Skin:  Skin is warm, dry and intact. No rash noted. Psychiatric: Mood and affect are normal. Speech and behavior are normal.  ____________________________________________   LABS (all labs ordered are listed, but only abnormal results are displayed)  Labs Reviewed  CBC WITH DIFFERENTIAL/PLATELET - Abnormal; Notable for the following components:      Result Value   Platelets 430 (*)    All other components within normal limits  BASIC METABOLIC PANEL  URIC ACID  SEDIMENTATION RATE   ____________________________________________  EKG   ____________________________________________  RADIOLOGY  ED MD interpretation:    Official radiology report(s): Dg Finger Index Right  Result Date: 04/28/2019 CLINICAL DATA:  Acute RIGHT index finger pain swelling. No known injury. Initial encounter. EXAM: RIGHT INDEX FINGER 2+V COMPARISON:  None. FINDINGS: A tiny bony density at the volar plate of the middle phalanx is noted. No other fracture, subluxation or dislocation identified. Soft tissue swelling is present. IMPRESSION: Probable volar plate avulsion fracture of the middle phalanx. Soft tissue swelling. Electronically Signed   By: Margarette Canada M.D.   On: 04/28/2019 09:53    ____________________________________________   PROCEDURES  Procedure(s) performed (including Critical Care):  .Splint Application  Date/Time: 04/28/2019 10:32 AM Performed by: McLamb, Berniece Salines, RN Authorized by: Sable Feil, PA-C   Consent:    Consent obtained:  Verbal   Consent given by:  Patient   Risks  discussed:  Swelling Pre-procedure details:    Sensation:  Normal Procedure details:    Laterality:  Right   Location:  Finger   Finger:  R index finger   Strapping: no     Splint type:  Finger   Supplies:  Aluminum splint Post-procedure details:    Pain:  Unchanged   Sensation:  Normal   Patient tolerance of procedure:  Tolerated well, no immediate complications     ____________________________________________   INITIAL IMPRESSION / ASSESSMENT AND PLAN / ED COURSE  As part of my medical decision making, I reviewed the following data within the [place] was evaluated in Emergency Department on 04/28/2019 for the symptoms described in the history of present illness. She was evaluated in the context of the global COVID-19 pandemic, which necessitated consideration  that the patient might be at risk for infection with the SARS-CoV-2 virus that causes COVID-19. Institutional protocols and algorithms that pertain to the evaluation of patients at risk for COVID-19 are in a state of rapid change based on information released by regulatory bodies including the CDC and federal and state organizations. These policies and algorithms were followed during the patient's care in the ED.  Patient presents with 2 days of swelling to the right index finger.  Discussed labs and x-ray findings with patient.  Patient placed in a finger splint and given discharge care instruction.  Advised follow orthopedic if no improvement in 7 to 10 days.     ____________________________________________   FINAL CLINICAL IMPRESSION(S) / ED DIAGNOSES  Final diagnoses:  Avulsion fracture of middle phalanx of finger, closed, initial encounter     ED Discharge Orders         Ordered    naproxen (NAPROSYN) 500 MG tablet  2 times daily with meals     04/28/19 1030           Note:  This document was prepared using Dragon voice recognition software and may include  unintentional dictation errors.    Sable Feil, PA-C 04/28/19 1033    Nena Polio, MD 04/28/19 [phone]

## 2019-04-28 NOTE — ED Triage Notes (Signed)
Woke up with right hand pain.

## 2019-04-28 NOTE — ED Notes (Signed)
See triage note  Presents with pain and some swelling to right hand  Denies any injury  Sx;s started yesterday   Worse today

## 2019-05-25 ENCOUNTER — Encounter (HOSPITAL_BASED_OUTPATIENT_CLINIC_OR_DEPARTMENT_OTHER): Payer: Self-pay

## 2019-05-25 ENCOUNTER — Ambulatory Visit (HOSPITAL_BASED_OUTPATIENT_CLINIC_OR_DEPARTMENT_OTHER): Admit: 2019-05-25 | Payer: Medicaid Other | Admitting: Podiatry

## 2019-05-25 SURGERY — STEROID INJECTION
Anesthesia: General | Laterality: Right

## 2019-07-26 ENCOUNTER — Telehealth: Payer: Self-pay

## 2019-07-26 NOTE — Telephone Encounter (Signed)
Pt calling; doesn't see info of tubal she had in 2016 on MyChart.  [phone]  Pt is going to try to have a tubal reversal and needs to know how it was done for provider.  Adv [facility].

## 2020-01-30 ENCOUNTER — Ambulatory Visit: Payer: Medicaid Other

## 2020-01-31 ENCOUNTER — Other Ambulatory Visit: Payer: Self-pay

## 2020-01-31 ENCOUNTER — Ambulatory Visit: Payer: Self-pay | Admitting: Physician Assistant

## 2020-01-31 ENCOUNTER — Encounter: Payer: Self-pay | Admitting: Physician Assistant

## 2020-01-31 DIAGNOSIS — Z113 Encounter for screening for infections with a predominantly sexual mode of transmission: Secondary | ICD-10-CM

## 2020-01-31 LAB — WET PREP FOR TRICH, YEAST, CLUE
Trichomonas Exam: NEGATIVE
Yeast Exam: NEGATIVE

## 2020-01-31 NOTE — Progress Notes (Signed)
Wet mount reviewed, no tx per standing order. Provider orders completed. 

## 2020-01-31 NOTE — Progress Notes (Signed)
[facility] Department STI clinic/screening visit  Subjective:  Abigail [name] is a 29 y.o. female being seen today for an STI screening visit. The patient reports they do not have symptoms.  Patient reports that they do not desire a pregnancy in the next year.   They reported they are not interested in discussing contraception today.  No LMP recorded.   Patient has the following medical conditions:   Patient Active Problem List   Diagnosis Date Noted  . Vaginitis and vulvovaginitis 11/03/2016  . Labor and delivery, indication for care 06/20/2015  . Lumbago 06/18/2015  . Pelvic pain affecting pregnancy 05/28/2015  . Abdominal pain affecting pregnancy 04/12/2015    Chief Complaint  Patient presents with  . SEXUALLY TRANSMITTED DISEASE    STD screening including bloodwork    HPI  Patient reports that she is not having any symptoms but found out that her partner has had another partner and she would like a screening.  Denies regular medications.  Reports that she has endometriosis and has had a BTL for BCM.  States last HIV test and pap was in 2020.   See flowsheet for further details and programmatic requirements.    The following portions of the patient's history were reviewed and updated as appropriate: allergies, current medications, past medical history, past social history, past surgical history and problem list.  Objective:  There were no vitals filed for this visit.  Physical Exam Constitutional:      General: She is not in acute distress.    Appearance: Normal appearance.  HENT:     Head: Normocephalic and atraumatic.     Comments: No nits, lice, or hair loss. No cervical, supraclavicular or axillary adenopathy.    Mouth/Throat:     Mouth: Mucous membranes are moist.     Pharynx: Oropharynx is clear. No oropharyngeal exudate or posterior oropharyngeal erythema.  Eyes:     Conjunctiva/sclera: Conjunctivae normal.  Pulmonary:     Effort: Pulmonary  effort is normal.  Abdominal:     Palpations: Abdomen is soft. There is no mass.     Tenderness: There is no abdominal tenderness. There is no guarding or rebound.  Genitourinary:    General: Normal vulva.     Rectum: Normal.     Comments: External genitalia/pubic area without nits, lice, edema, erythema, lesions and inguinal adenopathy. Vagina with normal mucosa and small amount of pinkish discharge. Cervix without visible lesions. Uterus firm, mobile, nt, no masses, no CMT, no adnexal tenderness or fullness. Musculoskeletal:     Cervical back: Neck supple. No tenderness.  Skin:    General: Skin is warm and dry.     Findings: No bruising, erythema, lesion or rash.  Neurological:     Mental Status: She is alert and oriented to person, place, and time.  Psychiatric:        Mood and Affect: Mood normal.        Behavior: Behavior normal.        Thought Content: Thought content normal.        Judgment: Judgment normal.      Assessment and Plan:  Abigail [name] is a 29 y.o. female presenting to the [facility] Department for STI screening  1. Screening for STD (sexually transmitted disease) Patient into clinic without symptoms. Rec condoms with all sex. Await test results.  Counseled that RN will call if needs to RTC for treatment once results are back. - WET PREP FOR TRICH, YEAST, CLUE -  Gonococcus culture - Chlamydia/Gonorrhea [place] [facility] - HIV [place] [facility] - Syphilis Serology, [place] [facility]     No follow-ups on file.  Future Appointments  Date Time Provider Department Center  02/17/2020  1:30 PM Vena Austria, MD [facility] None    Marylynn Pearson [place], [place]

## 2020-02-05 LAB — GONOCOCCUS CULTURE

## 2020-02-17 ENCOUNTER — Encounter: Payer: Self-pay | Admitting: Obstetrics and Gynecology

## 2020-02-17 ENCOUNTER — Ambulatory Visit (INDEPENDENT_AMBULATORY_CARE_PROVIDER_SITE_OTHER): Payer: Medicaid Other | Admitting: Obstetrics and Gynecology

## 2020-02-17 ENCOUNTER — Other Ambulatory Visit (HOSPITAL_COMMUNITY)
Admission: RE | Admit: 2020-02-17 | Discharge: 2020-02-17 | Disposition: A | Discharge status: Home / Self Care | Payer: Medicaid Other | Source: Ambulatory Visit | Attending: Obstetrics and Gynecology | Admitting: Obstetrics and Gynecology

## 2020-02-17 ENCOUNTER — Other Ambulatory Visit: Payer: Self-pay

## 2020-02-17 VITALS — BP 102/72 | HR 88 | Ht 66.0 in | Wt 228.0 lb

## 2020-02-17 DIAGNOSIS — Z113 Encounter for screening for infections with a predominantly sexual mode of transmission: Secondary | ICD-10-CM

## 2020-02-17 DIAGNOSIS — G8929 Other chronic pain: Secondary | ICD-10-CM

## 2020-02-17 DIAGNOSIS — Z01419 Encounter for gynecological examination (general) (routine) without abnormal findings: Secondary | ICD-10-CM

## 2020-02-17 DIAGNOSIS — Z1239 Encounter for other screening for malignant neoplasm of breast: Secondary | ICD-10-CM

## 2020-02-17 DIAGNOSIS — R300 Dysuria: Secondary | ICD-10-CM

## 2020-02-17 DIAGNOSIS — Z124 Encounter for screening for malignant neoplasm of cervix: Secondary | ICD-10-CM

## 2020-02-17 LAB — POCT URINALYSIS DIPSTICK
Bilirubin, UA: NEGATIVE
Glucose, UA: NEGATIVE
Nitrite, UA: NEGATIVE
Protein, UA: POSITIVE — AB
Spec Grav, UA: >=1.03 — AB (ref 1.010–1.025)
Urobilinogen, UA: 0.2 E.U./dL
pH, UA: 5 (ref 5.0–8.0)

## 2020-02-17 NOTE — Progress Notes (Signed)
Gynecology Annual Exam   PCP: Patient, No Pcp Per  Chief Complaint:  Chief Complaint  Patient presents with   Gynecologic Exam    Bleeding between cycles, clots   Urinary Tract Infection    History of Present Illness: Patient is a 29 y.o. [id number] presents for annual exam. The patient has no complaints today.   LMP: Patient's last menstrual period was 01/24/2020. Average Interval: regular, 28 days Duration of flow: 5 days Heavy Menses: yes Clots: no Intermenstrual Bleeding: yes Postcoital Bleeding: no Dysmenorrhea: yes  The patient is sexually active. She currently uses tubal ligation for contraception. She has dyspareunia.  The patient does perform self breast exams.  There is no notable family history of breast or ovarian cancer in her family.  The patient wears seatbelts: yes.   The patient has regular exercise: not asked.    The patient denies current symptoms of depression.    Review of Systems: Review of Systems  Constitutional: Negative for chills and fever.  HENT: Negative for congestion.   Respiratory: Negative for cough and shortness of breath.   Cardiovascular: Negative for chest pain and palpitations.  Gastrointestinal: Negative for abdominal pain, constipation, diarrhea, heartburn, nausea and vomiting.  Genitourinary: Negative for dysuria, frequency and urgency.  Skin: Negative for itching and rash.  Neurological: Negative for dizziness and headaches.  Endo/Heme/Allergies: Negative for polydipsia.  Psychiatric/Behavioral: Negative for depression.    Past Medical History:  Patient Active Problem List   Diagnosis Date Noted   Vaginitis and vulvovaginitis 11/03/2016   Labor and delivery, indication for care 06/20/2015   Lumbago 06/18/2015   Pelvic pain affecting pregnancy 05/28/2015   Abdominal pain affecting pregnancy 04/12/2015    Past Surgical History:  Past Surgical History:  Procedure Laterality Date   IUD REMOVAL     laproscopy w/  attempt to remove L cyst but cyst was no longer present Left 2015   TUBAL LIGATION  06/21/2015   Procedure: POST PARTUM TUBAL LIGATION;  Surgeon: Elenora Fender Ward, MD;  Location: [facility] ORS;  Service: Gynecology;;    Gynecologic History:  Patient's last menstrual period was 01/24/2020. Contraception: tubal ligation Last Pap: Results were:11/13/2014 no abnormalities   Obstetric History: [id number]  Family History:  Family History  Problem Relation Age of Onset   Hypertension Mother    Diabetes Mother    Cancer Mother        Ovarian   Hypertension Maternal Grandmother     Social History:  Social History   Socioeconomic History   Marital status: Single    Spouse name: Not on file   Number of children: Not on file   Years of education: Not on file   Highest education level: Not on file  Occupational History   Not on file  Tobacco Use   Smoking status: Current Every Day Smoker    Packs/day: 0.50    Types: Cigarettes    Last attempt to quit: 02/18/2015    Years since quitting: 5.0   Smokeless tobacco: Never Used  Vaping Use   Vaping Use: Never used  Substance and Sexual Activity   Alcohol use: No    Comment: occ   Drug use: No   Sexual activity: Yes    Birth control/protection: Surgical    Comment: Tubal Ligation  Other Topics Concern   Not on file  Social History Narrative   Not on file   Social Determinants of Health   Financial Resource Strain:    Difficulty of  Paying Living Expenses:   Food Insecurity:    Worried About Programme researcher, broadcasting/film/video in the Last Year:    Barista in the Last Year:   Transportation Needs:    Freight forwarder (Medical):    Lack of Transportation (Non-Medical):   Physical Activity:    Days of Exercise per Week:    Minutes of Exercise per Session:   Stress:    Feeling of Stress :   Social Connections:    Frequency of Communication with Friends and Family:    Frequency of Social Gatherings with  Friends and Family:    Attends Religious Services:    Active Member of Clubs or Organizations:    Attends Banker Meetings:    Marital Status:   Intimate Partner Violence:    Fear of Current or Ex-Partner:    Emotionally Abused:    Physically Abused:    Sexually Abused:     Allergies:  Allergies  Allergen Reactions   Penicillins     Reaction unknown; states she was a child. Pt states she had amoxicillin in 2020 for dental issue without problem.    Medications: Prior to Admission medications   Medication Sig Start Date End Date Taking? Authorizing Provider  Albuterol Sulfate (PROAIR RESPICLICK) 108 (90 Base) MCG/ACT AEPB Inhale 2 puffs into the lungs every 4 (four) hours as needed. 12/29/17  Yes Irean Hong, MD  clonazePAM (KLONOPIN) 0.5 MG tablet Take 0.5 mg by mouth 2 (two) times daily as needed for anxiety.   Yes [provider]  hydrOXYzine (ATARAX/VISTARIL) 25 MG tablet Take 1 tablet (25 mg total) by mouth 3 (three) times daily as needed for anxiety. 12/14/18  Yes Minna Antis, MD    Physical Exam Vitals: Blood pressure 102/72, pulse 88, height 5\' 6"  (1.676 m), weight (!) 228 lb (103.4 kg), last menstrual period 01/24/2020.  General: NAD HEENT: normocephalic, anicteric Thyroid: no enlargement, no palpable nodules Pulmonary: No increased work of breathing, CTAB Cardiovascular: RRR, distal pulses 2+ Breast: Breast symmetrical, no tenderness, no palpable nodules or masses, no skin or nipple retraction present, no nipple discharge.  No axillary or supraclavicular lymphadenopathy. Abdomen: NABS, soft, non-tender, non-distended.  Umbilicus without lesions.  No hepatomegaly, splenomegaly or masses palpable. No evidence of hernia  Genitourinary:  External: Normal external female genitalia.  Normal urethral meatus, normal Bartholin's and Skene's glands.    Vagina: Normal vaginal mucosa, no evidence of prolapse.    Cervix: Grossly normal in  appearance, no bleeding  Uterus: Non-enlarged, mobile, normal contour.  No CMT  Adnexa: ovaries non-enlarged, no adnexal masses  Rectal: deferred  Lymphatic: no evidence of inguinal lymphadenopathy Extremities: no edema, erythema, or tenderness Neurologic: Grossly intact Psychiatric: mood appropriate, affect full  Female chaperone present for pelvic and breast  portions of the physical exam    Assessment: 29 y.o. 26 routine annual exam  Plan: Problem List Items Addressed This Visit    None    Visit Diagnoses    Encounter for gynecological examination without abnormal finding    -  Primary   Screening for malignant neoplasm of cervix       Relevant Orders   Cytology - PAP   Breast screening       Burning with urination       Relevant Orders   POCT urinalysis dipstick (Completed)   Urine Culture   Routine screening for STI (sexually transmitted infection)       Relevant Orders  Cytology - PAP   Chronic pelvic pain in female       Relevant Orders   US Transvaginal Non-OB      1) STI screening  was not offered and therefore not obtained  2)  ASCCP guidelines and rational discussed.  Patient opts for every 3 years screening interval  3) Contraception - the patient is currently using  tubal ligation.  She is happy with her current form of contraception and plans to continue  4) Routine healthcare maintenance including cholesterol, diabetes screening discussed managed by PCP  5) Chronic pelvic pain and AUB - will obtained TVUS  6)  Return in about 2 weeks (around 03/02/2020) for TVUS and follow up.   Vena Austria, MD, [facility], [facility] 02/17/2020, 1:48 PM

## 2020-02-22 LAB — URINE CULTURE

## 2020-02-23 ENCOUNTER — Other Ambulatory Visit: Payer: Self-pay | Admitting: Obstetrics and Gynecology

## 2020-02-23 LAB — CYTOLOGY - PAP
Chlamydia: NEGATIVE
Comment: NEGATIVE
Comment: NEGATIVE
Comment: NEGATIVE
Comment: NORMAL
Diagnosis: NEGATIVE
High risk HPV: NEGATIVE
Neisseria Gonorrhea: NEGATIVE
Trichomonas: NEGATIVE

## 2020-02-23 MED ORDER — CIPROFLOXACIN HCL 500 MG PO TABS: 500.0000 mg | ORAL_TABLET | Freq: Two times a day (BID) | ORAL | 0 refills | Status: DC

## 2020-02-24 NOTE — Telephone Encounter (Signed)
Pt calling; AMS sent in rx but pt wants to know what was wrong c her urine culture.  [phone]  Pt aware culture grew klebsiella.  Adv to take all of the medication even if she starts to feel better.

## 2020-03-08 ENCOUNTER — Ambulatory Visit (INDEPENDENT_AMBULATORY_CARE_PROVIDER_SITE_OTHER): Payer: Self-pay | Admitting: Obstetrics and Gynecology

## 2020-03-08 ENCOUNTER — Ambulatory Visit (INDEPENDENT_AMBULATORY_CARE_PROVIDER_SITE_OTHER): Payer: Self-pay

## 2020-03-08 ENCOUNTER — Encounter: Payer: Self-pay | Admitting: Obstetrics and Gynecology

## 2020-03-08 ENCOUNTER — Other Ambulatory Visit: Payer: Self-pay

## 2020-03-08 VITALS — BP 136/74 | Ht 66.0 in | Wt 232.0 lb

## 2020-03-08 DIAGNOSIS — R102 Pelvic and perineal pain: Secondary | ICD-10-CM

## 2020-03-08 DIAGNOSIS — N939 Abnormal uterine and vaginal bleeding, unspecified: Secondary | ICD-10-CM

## 2020-03-08 DIAGNOSIS — G8929 Other chronic pain: Secondary | ICD-10-CM

## 2020-03-08 NOTE — Patient Instructions (Signed)
1) Do nothing 2) Hormonal management with norethindrone, depo provera 3) Orilissa (would likely stop bleeding and decrease pain in 6 weeks) 4) Laparoscopy and hysteroscopy (look inside abdomen and uterus with a  Camera) 5) Hysterectomy (will stop bleeding, will not necessarily improve pain)

## 2020-03-14 NOTE — Progress Notes (Signed)
Gynecology Ultrasound Follow Up  Chief Complaint:  Chief Complaint  Patient presents with  . Gynecologic Exam     History of Present Illness: Patient is a 29 y.o. female who presents today for ultrasound evaluation of pelvic pain and AUB.  Ultrasound demonstrates the following findgins Adnexa: Small corpus luteum cyst in left ovary, normal right ovary Uterus: Non-enlarged and homogenous myometrium  with endometrial stripe 11.48m without focal abnormaliteis Additional: no free fluid  Review of Systems: Review of Systems  Constitutional: Negative.   Gastrointestinal: Negative.   Genitourinary: Negative.     Past Medical History:  Past Medical History:  Diagnosis Date  . BRCA negative 08/2016  . Endometriosis   . Ovarian cyst     Past Surgical History:  Past Surgical History:  Procedure Laterality Date  . IUD REMOVAL    . laproscopy w/ attempt to remove L cyst but cyst was no longer present Left 2015  . TUBAL LIGATION  06/21/2015   Procedure: POST PARTUM TUBAL LIGATION;  Surgeon: CHonor LohWard, MD;  Location: [facility] ORS;  Service: Gynecology;;    Gynecologic History:  Patient's last menstrual period was 02/19/2020. Contraception: tubal ligation Last Pap: 02/17/2020 Results were: .no abnormalities  Family History:  Family History  Problem Relation Age of Onset  . Hypertension Mother   . Diabetes Mother   . Cancer Mother        Ovarian  . Hypertension Maternal Grandmother     Social History:  Social History   Socioeconomic History  . Marital status: Single    Spouse name: Not on file  . Number of children: Not on file  . Years of education: Not on file  . Highest education level: Not on file  Occupational History  . Not on file  Tobacco Use  . Smoking status: Current Every Day Smoker    Packs/day: 0.50    Types: Cigarettes    Last attempt to quit: 02/18/2015    Years since quitting: 5.0  . Smokeless tobacco: Never Used  Vaping Use  . Vaping Use:  Never used  Substance and Sexual Activity  . Alcohol use: No    Comment: occ  . Drug use: No  . Sexual activity: Yes    Birth control/protection: Surgical    Comment: Tubal Ligation  Other Topics Concern  . Not on file  Social History Narrative  . Not on file   Social Determinants of Health   Financial Resource Strain:   . Difficulty of Paying Living Expenses: Not on file  Food Insecurity:   . Worried About RCharity fundraiserin the Last Year: Not on file  . Ran Out of Food in the Last Year: Not on file  Transportation Needs:   . Lack of Transportation (Medical): Not on file  . Lack of Transportation (Non-Medical): Not on file  Physical Activity:   . Days of Exercise per Week: Not on file  . Minutes of Exercise per Session: Not on file  Stress:   . Feeling of Stress : Not on file  Social Connections:   . Frequency of Communication with Friends and Family: Not on file  . Frequency of Social Gatherings with Friends and Family: Not on file  . Attends Religious Services: Not on file  . Active Member of Clubs or Organizations: Not on file  . Attends CArchivistMeetings: Not on file  . Marital Status: Not on file  Intimate Partner Violence:   . Fear of  Current or Ex-Partner: Not on file  . Emotionally Abused: Not on file  . Physically Abused: Not on file  . Sexually Abused: Not on file    Allergies:  Allergies  Allergen Reactions  . Penicillins     Reaction unknown; states she was a child. Pt states she had amoxicillin in 2020 for dental issue without problem.    Medications: Prior to Admission medications   Medication Sig Start Date End Date Taking? Authorizing Provider  Albuterol Sulfate (PROAIR RESPICLICK) 233 (90 Base) MCG/ACT AEPB Inhale 2 puffs into the lungs every 4 (four) hours as needed. 12/29/17  Yes Paulette Blanch, MD  ciprofloxacin (CIPRO) 500 MG tablet Take 1 tablet (500 mg total) by mouth 2 (two) times daily. 02/23/20  Yes Malachy Mood, MD    clonazePAM (KLONOPIN) 0.5 MG tablet Take 0.5 mg by mouth 2 (two) times daily as needed for anxiety.   Yes [provider]  hydrOXYzine (ATARAX/VISTARIL) 25 MG tablet Take 1 tablet (25 mg total) by mouth 3 (three) times daily as needed for anxiety. 12/14/18  Yes Harvest Dark, MD    Physical Exam Vitals: Blood pressure 136/74, height 5' 6"  (1.676 m), weight 232 lb (105.2 kg), last menstrual period 02/19/2020.  General: NAD HEENT: normocephalic, anicteric Pulmonary: No increased work of breathing Extremities: no edema, erythema, or tenderness Neurologic: Grossly intact, normal gait Psychiatric: mood appropriate, affect full  US Transvaginal Non-OB  Result Date: 03/08/2020 Patient Name: Abigail [name] DOB: [birth year] MRN: [id number] ULTRASOUND REPORT Location: [facility] Date of Service: 03/08/2020 Indications:Pelvic Pain Findings: The uterus is retroverted and measures 7.2 x 6.0 x 4.5 cm. Echo texture is homogenous without evidence of focal masses. The Endometrium measures 11.1 mm. Right Ovary measures 4.1 x 2.4 x 2.6 cm. It is normal in appearance. Left Ovary measures 4.4 x 2.8 x 2.9 cm. It is normal in appearance. There is a small complex cyst with blood flow around the periphery only in the left ovary. This most likely represents a corpus luteal cyst. It measures                14.6 x 19.7 x 21.8 mm. Survey of the adnexa demonstrates no adnexal masses. There is a small amount of  free fluid with debris in the cul de sac. It measures 21.4 x 11.9 x 22.3 mm. Impression: 1. Normal appearing uterus and cervix. 2. Normal appearing right ovary. 3. There is a corpus luteal cyst in the left ovary. Recommendations: 1.Clinical correlation with the patient's History and Physical Exam. Gweneth Dimitri, RT Images reviewed.  Normal GYN study without visualized pathology.  Malachy Mood, MD, [facility], [facility] 03/08/2020, 4:41 PM     Assessment: 29 y.o.  [id number] follow up pelvic pain  Plan: Problem List Items Addressed This Visit    None    Visit Diagnoses    Pelvic pain    -  Primary   Abnormal uterine bleeding          1) AUB/pelvic pain - normal TVUS.  No etiology to explain AUB or pelvic pain.  We discussed management options including   A) expectant management B) hormonal management (OCP, IUD, depo provera, norethindrone. Freida Busman) C) endometrial ablation (novasure endometrial ablation) D) diagnostic laparoscopy, hysteroscopy E) hysterectomy Patient to contemplate options and to let me know of her decision  2) A total of 15 minutes were spent in face-to-face contact with the patient during this encounter with over half of that  time devoted to counseling and coordination of care.   3) Return if symptoms worsen or fail to improve.    Malachy Mood, MD, [facility], [place]

## 2020-05-03 ENCOUNTER — Emergency Department: Payer: Medicaid Other

## 2020-05-03 ENCOUNTER — Other Ambulatory Visit: Payer: Self-pay

## 2020-05-03 DIAGNOSIS — Z8742 Personal history of other diseases of the female genital tract: Secondary | ICD-10-CM | POA: Insufficient documentation

## 2020-05-03 DIAGNOSIS — N83292 Other ovarian cyst, left side: Secondary | ICD-10-CM | POA: Insufficient documentation

## 2020-05-03 DIAGNOSIS — R1030 Lower abdominal pain, unspecified: Secondary | ICD-10-CM

## 2020-05-03 DIAGNOSIS — F17210 Nicotine dependence, cigarettes, uncomplicated: Secondary | ICD-10-CM | POA: Insufficient documentation

## 2020-05-03 DIAGNOSIS — R52 Pain, unspecified: Secondary | ICD-10-CM

## 2020-05-03 LAB — COMPREHENSIVE METABOLIC PANEL
ALT: 16 U/L (ref 0–44)
AST: 19 U/L (ref 15–41)
Albumin: 4 g/dL (ref 3.5–5.0)
Alkaline Phosphatase: 49 U/L (ref 38–126)
Anion gap: 10 (ref 5–15)
BUN: 14 mg/dL (ref 6–20)
CO2: 22 mmol/L (ref 22–32)
Calcium: 9.1 mg/dL (ref 8.9–10.3)
Chloride: 106 mmol/L (ref 98–111)
Creatinine, Ser: 0.74 mg/dL (ref 0.44–1.00)
GFR, Estimated: >60 mL/min (ref >60)
Glucose, Bld: 84 mg/dL (ref 70–99)
Potassium: 3.6 mmol/L (ref 3.5–5.1)
Sodium: 138 mmol/L (ref 135–145)
Total Bilirubin: 0.7 mg/dL (ref 0.3–1.2)
Total Protein: 7.2 g/dL (ref 6.5–8.1)

## 2020-05-03 LAB — CBC
HCT: 37.8 % (ref 36.0–46.0)
Hemoglobin: 12.5 g/dL (ref 12.0–15.0)
MCH: 30.3 pg (ref 26.0–34.0)
MCHC: 33.1 g/dL (ref 30.0–36.0)
MCV: 91.5 fL (ref 80.0–100.0)
Platelets: 402 10*3/uL — ABNORMAL HIGH (ref 150–400)
RBC: 4.13 MIL/uL (ref 3.87–5.11)
RDW: 13.5 % (ref 11.5–15.5)
WBC: 11.8 10*3/uL — ABNORMAL HIGH (ref 4.0–10.5)
nRBC: 0 % (ref 0.0–0.2)

## 2020-05-03 LAB — URINALYSIS, COMPLETE (UACMP) WITH MICROSCOPIC
Bilirubin Urine: NEGATIVE
Glucose, UA: NEGATIVE mg/dL
Ketones, ur: 20 mg/dL — AB
Leukocytes,Ua: NEGATIVE
Nitrite: NEGATIVE
Protein, ur: NEGATIVE mg/dL
RBC / HPF: >50 RBC/hpf — ABNORMAL HIGH (ref 0–5)
Specific Gravity, Urine: 1.026 (ref 1.005–1.030)
pH: 6 (ref 5.0–8.0)

## 2020-05-03 LAB — POCT PREGNANCY, URINE: Preg Test, Ur: NEGATIVE

## 2020-05-03 NOTE — ED Triage Notes (Signed)
Pt in with co brownish discharge and today became more bloody with some "tissues or particles". Pt denies any hx of the same, pt has had a tubal ligation years ago. Pt does have urinary frequency and lower abd cramping. Pt does have a hx of ovarian cyst.

## 2020-05-04 ENCOUNTER — Emergency Department
Admission: EM | Admit: 2020-05-04 | Discharge: 2020-05-04 | Disposition: A | Discharge status: Home / Self Care | Payer: Medicaid Other | Attending: Emergency Medicine | Admitting: Emergency Medicine

## 2020-05-04 DIAGNOSIS — N83202 Unspecified ovarian cyst, left side: Secondary | ICD-10-CM

## 2020-05-04 DIAGNOSIS — R1030 Lower abdominal pain, unspecified: Secondary | ICD-10-CM

## 2020-05-04 DIAGNOSIS — R52 Pain, unspecified: Secondary | ICD-10-CM

## 2020-05-04 LAB — CHLAMYDIA/NGC RT PCR (ARMC ONLY)
Chlamydia Tr: NOT DETECTED
N gonorrhoeae: NOT DETECTED

## 2020-05-04 LAB — WET PREP, GENITAL
Clue Cells Wet Prep HPF POC: NONE SEEN
Sperm: NONE SEEN
Trich, Wet Prep: NONE SEEN
Yeast Wet Prep HPF POC: NONE SEEN

## 2020-05-04 NOTE — ED Provider Notes (Signed)
[facility] Emergency Department Provider Note   ____________________________________________   First MD Initiated Contact with Patient 05/04/20 0423     (approximate)  I have reviewed the triage vital signs and the nursing notes.   HISTORY  Chief Complaint Vaginal Discharge    HPI Abigail [name] is a 29 y.o. female with past medical history of endometriosis and ovarian cyst who presents to the ED complaining of vaginal discharge.  Patient reports that she has noticed some light vaginal spotting over the past couple of days, but the discharge changed color yesterday and became darker.  She is also noticed passage of a small amount of tissue and endorses some lower abdominal cramping.  She denies any dysuria, hematuria, nausea, vomiting, changes in bowel movements, or fevers.        Past Medical History:  Diagnosis Date  . BRCA negative 08/2016  . Endometriosis   . Ovarian cyst     Patient Active Problem List   Diagnosis Date Noted  . Vaginitis and vulvovaginitis 11/03/2016  . Labor and delivery, indication for care 06/20/2015  . Lumbago 06/18/2015  . Pelvic pain affecting pregnancy 05/28/2015  . Abdominal pain affecting pregnancy 04/12/2015    Past Surgical History:  Procedure Laterality Date  . IUD REMOVAL    . laproscopy w/ attempt to remove L cyst but cyst was no longer present Left 2015  . TUBAL LIGATION  06/21/2015   Procedure: POST PARTUM TUBAL LIGATION;  Surgeon: Honor Loh Ward, MD;  Location: [facility] ORS;  Service: Gynecology;;    Prior to Admission medications   Medication Sig Start Date End Date Taking? Authorizing Provider  Albuterol Sulfate (PROAIR RESPICLICK) 599 (90 Base) MCG/ACT AEPB Inhale 2 puffs into the lungs every 4 (four) hours as needed. 12/29/17   Paulette Blanch, MD  ciprofloxacin (CIPRO) 500 MG tablet Take 1 tablet (500 mg total) by mouth 2 (two) times daily. 02/23/20   Malachy Mood, MD  clonazePAM (KLONOPIN) 0.5 MG  tablet Take 0.5 mg by mouth 2 (two) times daily as needed for anxiety.    [provider]  hydrOXYzine (ATARAX/VISTARIL) 25 MG tablet Take 1 tablet (25 mg total) by mouth 3 (three) times daily as needed for anxiety. 12/14/18   Harvest Dark, MD    Allergies Penicillins  Family History  Problem Relation Age of Onset  . Hypertension Mother   . Diabetes Mother   . Cancer Mother        Ovarian  . Hypertension Maternal Grandmother     Social History Social History   Tobacco Use  . Smoking status: Current Every Day Smoker    Packs/day: 0.50    Types: Cigarettes    Last attempt to quit: 02/18/2015    Years since quitting: 5.2  . Smokeless tobacco: Never Used  Vaping Use  . Vaping Use: Never used  Substance Use Topics  . Alcohol use: No    Comment: occ  . Drug use: No    Review of Systems  Constitutional: No fever/chills Eyes: No visual changes. ENT: No sore throat. Cardiovascular: Denies chest pain. Respiratory: Denies shortness of breath. Gastrointestinal: Positive for pelvic and abdominal pain.  No nausea, no vomiting.  No diarrhea.  No constipation. Genitourinary: Negative for dysuria.  Positive for vaginal bleeding/discharge. Musculoskeletal: Negative for back pain. Skin: Negative for rash. Neurological: Negative for headaches, focal weakness or numbness.  ____________________________________________   PHYSICAL EXAM:  VITAL SIGNS: ED Triage Vitals  Enc Vitals Group  BP 05/03/20 2035 134/87     Pulse Rate 05/03/20 2035 82     Resp 05/03/20 2035 20     Temp 05/03/20 2035 98.4 F (36.9 C)     Temp Source 05/03/20 2035 Oral     SpO2 05/03/20 2035 100 %     Weight 05/03/20 2032 220 lb (99.8 kg)     Height 05/03/20 2032 _0  (1.676 m)     Head Circumference --      Peak Flow --      Pain Score 05/03/20 2032 7     Pain Loc --      Pain Edu? --      Excl. in [place]? --     Constitutional: Alert and oriented. Eyes: Conjunctivae are  normal. Head: Atraumatic. Nose: No congestion/rhinnorhea. Mouth/Throat: Mucous membranes are moist. Neck: Normal ROM Cardiovascular: Normal rate, regular rhythm. Grossly normal heart sounds. Respiratory: Normal respiratory effort.  No retractions. Lungs CTAB. Gastrointestinal: Soft and nontender. No distention. Genitourinary: deferred Musculoskeletal: No lower extremity tenderness nor edema. Neurologic:  Normal speech and language. No gross focal neurologic deficits are appreciated. Skin:  Skin is warm, dry and intact. No rash noted. Psychiatric: Mood and affect are normal. Speech and behavior are normal.  ____________________________________________   LABS (all labs ordered are listed, but only abnormal results are displayed)  Labs Reviewed  WET PREP, GENITAL - Abnormal; Notable for the following components:      Result Value   WBC, Wet Prep HPF POC FEW (*)    All other components within normal limits  CBC - Abnormal; Notable for the following components:   WBC 11.8 (*)    Platelets 402 (*)    All other components within normal limits  URINALYSIS, COMPLETE (UACMP) WITH MICROSCOPIC - Abnormal; Notable for the following components:   Color, Urine YELLOW (*)    APPearance CLEAR (*)    Hgb urine dipstick LARGE (*)    Ketones, ur 20 (*)    RBC / HPF >50 (*)    Bacteria, UA RARE (*)    All other components within normal limits  CHLAMYDIA/NGC RT PCR ([facility] ONLY)  COMPREHENSIVE METABOLIC PANEL  POC URINE PREG, ED  POCT PREGNANCY, URINE    PROCEDURES  Procedure(s) performed (including Critical Care):  Procedures   ____________________________________________   INITIAL IMPRESSION / ASSESSMENT AND PLAN / ED COURSE       29 year old female with past medical history of endometriosis and ovarian cyst who presents to the ED with 3 to 4 days of vaginal spotting that has not changed colors and become darker.  She endorses occasional abdominal and pelvic cramping, but  there is no tenderness on her exam.  Wet prep and GC/chlamydia testing are unremarkable.  Ultrasound was performed and negative for torsion but does show left hemorrhagic ovarian cyst, likely contributing to her symptoms.  She is appropriate for discharge home with OB/GYN follow-up, was counseled to return to the ED for new or worsening symptoms.  Patient agrees with plan.      ____________________________________________   FINAL CLINICAL IMPRESSION(S) / ED DIAGNOSES  Final diagnoses:  Pain  Hemorrhagic cyst of left ovary     ED Discharge Orders    None       Note:  This document was prepared using Dragon voice recognition software and may include unintentional dictation errors.   Blake Divine, MD 05/04/20 [phone]

## 2020-05-11 NOTE — Telephone Encounter (Signed)
Pt left msg on triage saying she had sent a mychart msg and was told she needed to call. Looking at previous msgs, pt was advised to make an appt for vag irritation/odor. Please contact pt to schedule appt.

## 2020-05-14 NOTE — Telephone Encounter (Signed)
Called and spoke with patient to schedule an appointment. I offered and opeing with Dr. Jean Rosenthal for tomorrow. Patient wants to wait to schedule and will call back to be scheduled

## 2020-10-24 ENCOUNTER — Emergency Department: Payer: Self-pay

## 2020-10-24 ENCOUNTER — Emergency Department
Admission: EM | Admit: 2020-10-24 | Discharge: 2020-10-24 | Disposition: A | Discharge status: Home / Self Care | Payer: Self-pay | Attending: Emergency Medicine | Admitting: Emergency Medicine

## 2020-10-24 ENCOUNTER — Other Ambulatory Visit: Payer: Self-pay

## 2020-10-24 DIAGNOSIS — M26629 Arthralgia of temporomandibular joint, unspecified side: Secondary | ICD-10-CM

## 2020-10-24 DIAGNOSIS — M26601 Right temporomandibular joint disorder, unspecified: Secondary | ICD-10-CM | POA: Insufficient documentation

## 2020-10-24 DIAGNOSIS — F17210 Nicotine dependence, cigarettes, uncomplicated: Secondary | ICD-10-CM | POA: Insufficient documentation

## 2020-10-24 MED ORDER — KETOROLAC TROMETHAMINE 60 MG/2ML IM SOLN
15.0000 mg | Freq: Once | INTRAMUSCULAR | Status: AC
  Administered 2020-10-24: 15 mg via INTRAMUSCULAR
  Filled 2020-10-24: qty 2

## 2020-10-24 MED ORDER — KETOROLAC TROMETHAMINE 10 MG PO TABS: 10.0000 mg | ORAL_TABLET | Freq: Four times a day (QID) | ORAL | 0 refills | Status: DC | PRN

## 2020-10-24 NOTE — ED Provider Notes (Signed)
[facility] Emergency Department Provider Note  ____________________________________________  Time seen: Approximately 10:09 AM  I have reviewed the triage vital signs and the nursing notes.   HISTORY  Chief Complaint Facial numbness    HPI Abigail [name] is a 30 y.o. female with a history of endometriosis who comes ED complaining of right jaw pain that started yesterday.  Constant, worse with jaw movement.  Constant, waxing and waning, nonradiating, [name] aggravating or alleviating factors.  Denies fall or dental injury.  [name] fever or chills.  Also endorses pain with swallowing.  [name] vomiting.  [name] difficulty breathing.  Does report a remote history of TMJ pain.  Is not aware of any recent jaw clenching or grinding.      Past Medical History:  Diagnosis Date  . BRCA negative 08/2016  . Endometriosis   . Ovarian cyst      Patient Active Problem List   Diagnosis Date Noted  . Vaginitis and vulvovaginitis 11/03/2016  . Labor and delivery, indication for care 06/20/2015  . Lumbago 06/18/2015  . Pelvic pain affecting pregnancy 05/28/2015  . Abdominal pain affecting pregnancy 04/12/2015     Past Surgical History:  Procedure Laterality Date  . IUD REMOVAL    . laproscopy w/ attempt to remove L cyst but cyst was [name] longer present Left 2015  . TUBAL LIGATION  06/21/2015   Procedure: POST PARTUM TUBAL LIGATION;  Surgeon: Honor Loh Ward, MD;  Location: [facility] ORS;  Service: Gynecology;;     Prior to Admission medications   Medication Sig Start Date End Date Taking? Authorizing Provider  ketorolac (TORADOL) 10 MG tablet Take 1 tablet (10 mg total) by mouth every 6 (six) hours as needed for moderate pain. 10/24/20  Yes Carrie Mew, MD  Albuterol Sulfate (PROAIR RESPICLICK) 094 (90 Base) MCG/ACT AEPB Inhale 2 puffs into the lungs every 4 (four) hours as needed. 12/29/17   Paulette Blanch, MD  ciprofloxacin (CIPRO) 500 MG tablet Take 1 tablet (500 mg total) by  mouth 2 (two) times daily. 02/23/20   Malachy Mood, MD  clonazePAM (KLONOPIN) 0.5 MG tablet Take 0.5 mg by mouth 2 (two) times daily as needed for anxiety.    [provider]  hydrOXYzine (ATARAX/VISTARIL) 25 MG tablet Take 1 tablet (25 mg total) by mouth 3 (three) times daily as needed for anxiety. 12/14/18   Harvest Dark, MD     Allergies Penicillins   Family History  Problem Relation Age of Onset  . Hypertension Mother   . Diabetes Mother   . Cancer Mother        Ovarian  . Hypertension Maternal Grandmother     Social History Social History   Tobacco Use  . Smoking status: Current Every Day Smoker    Packs/day: 0.50    Types: Cigarettes    Last attempt to quit: 02/18/2015    Years since quitting: 5.6  . Smokeless tobacco: Never Used  Vaping Use  . Vaping Use: Never used  Substance Use Topics  . Alcohol use: [name]    Comment: occ  . Drug use: [name]    Review of Systems  Constitutional:   [name] fever or chills.  ENT: Positive sore throat and right jaw pain.  [name] hearing change or tinnitus.  [name] rhinorrhea. Cardiovascular:   [name] chest pain or syncope. Respiratory:   [name] dyspnea or cough. Gastrointestinal:   Negative for abdominal pain, vomiting and diarrhea.  Musculoskeletal:   Negative for focal pain or swelling  All other systems reviewed and are negative except as documented above in ROS and HPI.  ____________________________________________   PHYSICAL EXAM:  VITAL SIGNS: ED Triage Vitals  Enc Vitals Group     BP 10/24/20 0813 112/80     Pulse Rate 10/24/20 0813 69     Resp 10/24/20 0813 18     Temp 10/24/20 0813 98.6 F (37 C)     Temp Source 10/24/20 0813 Oral     SpO2 10/24/20 0813 100 %     Weight --      Height 10/24/20 0810 _0  (1.676 m)     Head Circumference --      Peak Flow --      Pain Score 10/24/20 0810 7     Pain Loc --      Pain Edu? --      Excl. in [place]? --     Vital signs reviewed, nursing assessments  reviewed.   Constitutional:   Alert and oriented. Non-toxic appearance. Eyes:   Conjunctivae are normal. EOMI. PERRL. ENT      Head:   Normocephalic and atraumatic.  External ear canals normal, TMs normal.  Tenderness at right TMJ and right posterior mandible.  Tenderness in submandibular space.  Tenderness to percussion over the right maxillary sinus.  [name] asymmetry      Nose:   Normal      Mouth/Throat: Normal-appearing teeth and gums, [name] swelling or inflammation, [name] dental injuries or obvious decay.  Patient represents tenderness with axial pressure on right upper teeth.  [name] asymmetry or intraoral swelling.  [name] tongue elevation.  Patient has pain limited ability to open mouth, limiting visual inspection of OP and tonsils      Neck:   [name] meningismus. Full ROM.  [name] midline C-spine tenderness.  [name] palpable mass or inflammatory change Hematological/Lymphatic/Immunilogical:   [name] cervical lymphadenopathy. Cardiovascular:   RRR. Symmetric bilateral radial and DP pulses.  [name] murmurs. Cap refill less than 2 seconds. Respiratory:   Normal respiratory effort without tachypnea/retractions. Breath sounds are clear and equal bilaterally. [name] wheezes/rales/rhonchi. Gastrointestinal:   Soft and nontender. Non distended. There is [name] CVA tenderness.  [name] rebound, rigidity, or guarding.  Musculoskeletal:   Normal range of motion in all extremities. [name] joint effusions.  [name] lower extremity tenderness.  [name] edema. Neurologic:   Normal speech and language.  Motor grossly intact. [name] acute focal neurologic deficits are appreciated.  Skin:    Skin is warm, dry and intact. [name] rash noted.  [name] petechiae, purpura, or bullae.  ____________________________________________    LABS (pertinent positives/negatives) (all labs ordered are listed, but only abnormal results are displayed) Labs Reviewed - [name] data to display ____________________________________________   EKG Interpreted by me  Date: 10/24/2020  Rate: 63   Rhythm: normal sinus rhythm  QRS Axis: normal  Intervals: normal  ST/T Wave abnormalities: normal  Conduction Disutrbances: none  Narrative Interpretation: unremarkable       ____________________________________________    RADIOLOGY  CT Maxillofacial Wo Contrast  Result Date: 10/24/2020 CLINICAL DATA:  Right mandibular region pain EXAM: CT MAXILLOFACIAL WITHOUT CONTRAST TECHNIQUE: Multidetector CT imaging of the maxillofacial structures was performed. Multiplanar CT image reconstructions were also generated. COMPARISON:  None. FINDINGS: Osseous: [name] fracture or dislocation. [name] blastic or lytic bone lesions. Mandibular condyles bilaterally appear symmetric without appreciable arthropathy. [name] erosion. There appears to be normal alignment of the mandibular condyles with respective temporal eminences. Visualized cervical spine unremarkable. Orbits: Orbits appear symmetric bilaterally. [name]  intraorbital lesions. Sinuses: There is mucosal thickening in the inferior left maxillary antrum. Other paranasal sinuses are clear. Note that frontal sinuses are essentially aplastic. [name] air-fluid level. [name] bony destruction or expansion. Ostiomeatal unit complexes are patent bilaterally. There is rightward deviation of the nasal septum. Concha bullosa noted on the left, an anatomic variant. Soft tissues: [name] soft tissue mass or hematoma. [name] evidence soft tissue abscess. Salivary glands appear symmetric and unremarkable bilaterally. [name] adenopathy. Tongue and tongue base regions appear unremarkable. Visualized pharynx appears unremarkable. Note that the styloid process on the left is longer than its counterpart on the right. Limited intracranial: Visualized intracranial structures appear unremarkable. IMPRESSION: 1. [name] fracture or dislocation. Temporomandibular joints appear unremarkable by CT and symmetric. Note that the temporomandibular joint menisci are not appreciable by CT. If there is concern for temporomandibular  joint meniscal subluxation, MR without contrast of the temporomandibular joints would be the imaging study of choice to further evaluate. 2. Left maxillary sinus disease. Frontal sinuses virtually aplastic. Other paranasal sinuses clear. 3.  Rightward deviation nasal septum. 4. The left styloid process is longer than its right-sided counterpart. The clinical significance of this finding is uncertain. Electronically Signed   By: Lowella Grip III M.D.   On: 10/24/2020 09:23    ____________________________________________   PROCEDURES Procedures  ____________________________________________  DIFFERENTIAL DIAGNOSIS   TMJ syndrome, dental pain, tonsillar abscess, lymphadenopathy  CLINICAL IMPRESSION / ASSESSMENT AND PLAN / ED COURSE  Medications ordered in the ED: Medications  ketorolac (TORADOL) injection 15 mg (has [name] administration in time range)    Pertinent labs & imaging results that were available during my care of the patient were reviewed by me and considered in my medical decision making (see chart for details).  Abigail [name] was evaluated in Emergency Department on 10/24/2020 for the symptoms described in the history of present illness. She was evaluated in the context of the global COVID-19 pandemic, which necessitated consideration that the patient might be at risk for infection with the SARS-CoV-2 virus that causes COVID-19. Institutional protocols and algorithms that pertain to the evaluation of patients at risk for COVID-19 are in a state of rapid change based on information released by regulatory bodies including the CDC and federal and state organizations. These policies and algorithms were followed during the patient's care in the ED.   Patient presents with right jaw pain, most likely TMJ syndrome.  However, because of pain limiting her ability to open the mouth for exam, unable to clinically rule out a peritonsillar abscess.  Unlikely that there is any acute  infectious process given isolated symptoms and normal vital signs.  CT scan obtained which is essentially normal, [name] evidence of fluid collection or mass.  Will treat with Toradol, stable for discharge.  Referral to ENT if symptoms do not resolve.      ____________________________________________   FINAL CLINICAL IMPRESSION(S) / ED DIAGNOSES    Final diagnoses:  TMJ syndrome     ED Discharge Orders         Ordered    ketorolac (TORADOL) 10 MG tablet  Every 6 hours PRN        10/24/20 1008          Portions of this note were generated with dragon dictation software. Dictation errors may occur despite best attempts at proofreading.   Carrie Mew, MD 10/24/20 1021

## 2020-10-24 NOTE — ED Triage Notes (Addendum)
Pt to ER via POV from home with complaints of right sided facial numbness and pain. Reports a headache that started yesterday. This morning she woke up at 4am and has been unable to talk without difficulty, reports pain to R side of jaw and face. Denies dental pain. Difficulty smiling/ opening mouth but no facial droop noted. No issues with ambulation. Denies weakness in extremities. Able to write to communicate.

## 2020-11-14 ENCOUNTER — Ambulatory Visit: Payer: Self-pay | Admitting: Advanced Practice Midwife

## 2020-11-14 ENCOUNTER — Other Ambulatory Visit: Payer: Self-pay

## 2020-11-14 ENCOUNTER — Encounter: Payer: Self-pay | Admitting: Advanced Practice Midwife

## 2020-11-14 DIAGNOSIS — F1290 Cannabis use, unspecified, uncomplicated: Secondary | ICD-10-CM | POA: Insufficient documentation

## 2020-11-14 DIAGNOSIS — Z9851 Tubal ligation status: Secondary | ICD-10-CM | POA: Insufficient documentation

## 2020-11-14 DIAGNOSIS — Z113 Encounter for screening for infections with a predominantly sexual mode of transmission: Secondary | ICD-10-CM

## 2020-11-14 DIAGNOSIS — F17200 Nicotine dependence, unspecified, uncomplicated: Secondary | ICD-10-CM

## 2020-11-14 DIAGNOSIS — Z720 Tobacco use: Secondary | ICD-10-CM

## 2020-11-14 LAB — WET PREP FOR TRICH, YEAST, CLUE
Trichomonas Exam: NEGATIVE
Yeast Exam: NEGATIVE

## 2020-11-14 MED ORDER — METRONIDAZOLE 500 MG PO TABS: 500.0000 mg | ORAL_TABLET | Freq: Two times a day (BID) | ORAL | 0 refills | Status: AC

## 2020-11-14 NOTE — Progress Notes (Signed)
Wet mount results reviewed with Provider. Pt treated per Provider orders. Berdie Ogren, RN

## 2020-11-14 NOTE — Progress Notes (Signed)
[facility] Department STI clinic/screening visit  Subjective:  Abigail [name] is a 30 y.o. SBF G9P2 smoker female being seen today for an STI screening visit. The patient reports they do not have symptoms.  Patient reports that they do not desire a pregnancy in the next year.   They reported they are not interested in discussing contraception today.  Patient's last menstrual period was 10/29/2020.   Patient has the following medical conditions:   Patient Active Problem List   Diagnosis Date Noted  . Morbid obesity (HCC) 220 lbs 11/14/2020  . Marijuana use daily 11/14/2020  . Lumbago 06/18/2015    Chief Complaint  Patient presents with  . SEXUALLY TRANSMITTED DISEASE    screening    HPI  Patient reports increased libido. Last sex 11/11/20 without condom; with current partner x 1 mo.; 5 sex partners in last 3 mo. Last MJ yesterday. LMP 10/29/20. Last ETOH yesterday (1 Margarita)q weekend. Last vaped 11/10/20. Smoking 1/2 ppd. BTL 2016.  Last HIV test per patient/review of record was 01/31/20 Patient reports last pap was 02/17/20 neg HPV neg  See flowsheet for further details and programmatic requirements.    The following portions of the patient's history were reviewed and updated as appropriate: allergies, current medications, past medical history, past social history, past surgical history and problem list.  Objective:  There were no vitals filed for this visit.  Physical Exam Vitals and nursing note reviewed.  Constitutional:      Appearance: Normal appearance. She is obese.  HENT:     Head: Normocephalic and atraumatic.     Mouth/Throat:     Mouth: Mucous membranes are moist.     Pharynx: Oropharynx is clear. No oropharyngeal exudate or posterior oropharyngeal erythema.  Eyes:     Conjunctiva/sclera: Conjunctivae normal.  Pulmonary:     Effort: Pulmonary effort is normal.  Chest:  Breasts:     Right: No axillary adenopathy or supraclavicular adenopathy.      Left: No axillary adenopathy or supraclavicular adenopathy.    Abdominal:     Palpations: Abdomen is soft. There is no mass.     Tenderness: There is no abdominal tenderness. There is no rebound.     Comments: Soft without masses or tenderness  Genitourinary:    General: Normal vulva.     Exam position: Lithotomy position.     Pubic Area: No rash or pubic lice.      Labia:        Right: No rash or lesion.        Left: No rash or lesion.      Vagina: Normal. No vaginal discharge (light pink d/c, ph>4.5), erythema, bleeding or lesions.     Cervix: Normal.     Uterus: Normal.      Adnexa: Right adnexa normal and left adnexa normal.     Rectum: Normal.  Lymphadenopathy:     Head:     Right side of head: No preauricular or posterior auricular adenopathy.     Left side of head: No preauricular or posterior auricular adenopathy.     Cervical: No cervical adenopathy.     Upper Body:     Right upper body: No supraclavicular or axillary adenopathy.     Left upper body: No supraclavicular or axillary adenopathy.     Lower Body: No right inguinal adenopathy. No left inguinal adenopathy.  Skin:    General: Skin is warm and dry.     Findings: No rash.  Neurological:     Mental Status: She is alert and oriented to person, place, and time.      Assessment and Plan:  Abigail [name] is a 30 y.o. female presenting to the [facility] Department for STI screening  1. Screening examination for venereal disease Treat wet mount per standing orders Immunization nurse consult - WET PREP FOR TRICH, YEAST, CLUE - Chlamydia/Gonorrhea [place] [facility] - Syphilis Serology, [place] [facility] - HIV/HCV [place] [facility]  2. Morbid obesity (HCC) 220 lbs  3. Marijuana use daily      No follow-ups on file.  No future appointments.  Alberteen Spindle, CNM

## 2020-11-20 LAB — HM HEPATITIS C SCREENING LAB: HM Hepatitis Screen: NEGATIVE

## 2020-11-20 LAB — HM HIV SCREENING LAB: HM HIV Screening: NEGATIVE

## 2021-01-04 ENCOUNTER — Ambulatory Visit: Payer: Medicaid Other

## 2021-01-09 ENCOUNTER — Ambulatory Visit: Payer: Medicaid Other | Admitting: Physician Assistant

## 2021-01-09 ENCOUNTER — Encounter: Payer: Self-pay | Admitting: Physician Assistant

## 2021-01-09 ENCOUNTER — Ambulatory Visit: Payer: Medicaid Other

## 2021-01-09 ENCOUNTER — Other Ambulatory Visit: Payer: Self-pay

## 2021-01-09 DIAGNOSIS — Z299 Encounter for prophylactic measures, unspecified: Secondary | ICD-10-CM

## 2021-01-09 DIAGNOSIS — Z113 Encounter for screening for infections with a predominantly sexual mode of transmission: Secondary | ICD-10-CM

## 2021-01-09 LAB — WET PREP FOR TRICH, YEAST, CLUE
Trichomonas Exam: NEGATIVE
Yeast Exam: NEGATIVE

## 2021-01-09 MED ORDER — METRONIDAZOLE 500 MG PO TABS: 500.0000 mg | ORAL_TABLET | Freq: Two times a day (BID) | ORAL | 0 refills | Status: AC

## 2021-01-09 NOTE — Progress Notes (Signed)
Wet mount reviewed by provider, pt treated with Metronidazole per provider order. Provider orders completed.

## 2021-01-09 NOTE — Progress Notes (Signed)
[facility] Department STI clinic/screening visit  Subjective:  Abigail [name] is a 30 y.o. female being seen today for an STI screening visit. The patient reports they do have symptoms.  Patient reports that they do not desire a pregnancy in the next year.   They reported they are not interested in discussing contraception today.  Patient's last menstrual period was 12/20/2020 (approximate).   Patient has the following medical conditions:   Patient Active Problem List   Diagnosis Date Noted   Morbid obesity (HCC) 220 lbs 11/14/2020   Marijuana use daily 11/14/2020   H/O tubal ligation 2016 11/14/2020   Smoker 1/2 ppd 11/14/2020   Vapes nicotine containing substance 11/14/2020   Lumbago 06/18/2015    Chief Complaint  Patient presents with   SEXUALLY TRANSMITTED DISEASE    Screening    HPI  Patient reports that she has had a vaginal odor off and on for 2 months and for the last 1.5 weeks has had vaginal spotting.  Denies other symptoms, chronic conditions, regular medicines.  Reports last HIV test was in April of this year and last pap was earlier this year as well.   See flowsheet for further details and programmatic requirements.    The following portions of the patient's history were reviewed and updated as appropriate: allergies, current medications, past medical history, past social history, past surgical history and problem list.  Objective:  There were no vitals filed for this visit.  Physical Exam Constitutional:      General: She is not in acute distress.    Appearance: Normal appearance.  HENT:     Head: Normocephalic and atraumatic.     Comments: No nits,lice, or hair loss. No cervical, supraclavicular or axillary adenopathy.     Mouth/Throat:     Mouth: Mucous membranes are moist.     Pharynx: Oropharynx is clear. No oropharyngeal exudate or posterior oropharyngeal erythema.  Eyes:     Conjunctiva/sclera: Conjunctivae normal.  Pulmonary:      Effort: Pulmonary effort is normal.  Abdominal:     Palpations: Abdomen is soft. There is no mass.     Tenderness: There is no abdominal tenderness. There is no guarding or rebound.  Genitourinary:    General: Normal vulva.     Rectum: Normal.     Comments: External genitalia/pubic area without nits, lice, edema, erythema, lesions and inguinal adenopathy. Vagina with normal mucosa and small amount of thin, grayish discharge, pH =4.5. Cervix without visible lesions. Uterus firm, mobile, nt, no masses, no CMT, no adnexal tenderness or fullness.  Musculoskeletal:     Cervical back: Neck supple. No tenderness.  Skin:    General: Skin is warm and dry.     Findings: No bruising, erythema, lesion or rash.  Neurological:     Mental Status: She is alert and oriented to person, place, and time.  Psychiatric:        Mood and Affect: Mood normal.        Behavior: Behavior normal.        Thought Content: Thought content normal.        Judgment: Judgment normal.     Assessment and Plan:  Abigail [name] is a 30 y.o. female presenting to the [facility] Department for STI screening  1. Screening for STD (sexually transmitted disease) Patient into clinic with symptoms. Rec condoms with all sex. Await test results.  Counseled that RN will call if needs to RTC for treatment once results  are back.  - WET PREP FOR TRICH, YEAST, CLUE - Gonococcus culture - Chlamydia/Gonorrhea [place] [facility] - HIV [place] [facility] - Syphilis Serology, [place] [facility]  2. Prophylactic measure Reviewed wet mount results and will treat with Metronidazole 500 mg #14 1 po BID for 7 days with food, no EtOH for 24 hr before and until 72 hr after completing medicine. No sex for 10 days. Enc to use OTC antifungal cream if has itching during or just after antibiotic use. - metroNIDAZOLE (FLAGYL) 500 MG tablet; Take 1 tablet (500 mg total) by mouth 2 (two) times daily for 7 days.  Dispense: 14 tablet; Refill:  0     No follow-ups on file.  No future appointments.  Matt Holmes, PA

## 2021-01-16 LAB — GONOCOCCUS CULTURE

## 2021-01-17 LAB — HM HIV SCREENING LAB: HM HIV Screening: NEGATIVE

## 2021-02-20 ENCOUNTER — Emergency Department
Admission: EM | Admit: 2021-02-20 | Discharge: 2021-02-20 | Disposition: A | Discharge status: Home / Self Care | Payer: Medicaid Other | Attending: Emergency Medicine | Admitting: Emergency Medicine

## 2021-02-20 ENCOUNTER — Other Ambulatory Visit: Payer: Self-pay

## 2021-02-20 DIAGNOSIS — Z5321 Procedure and treatment not carried out due to patient leaving prior to being seen by health care provider: Secondary | ICD-10-CM | POA: Insufficient documentation

## 2021-02-20 DIAGNOSIS — R509 Fever, unspecified: Secondary | ICD-10-CM | POA: Insufficient documentation

## 2021-02-20 DIAGNOSIS — R519 Headache, unspecified: Secondary | ICD-10-CM | POA: Insufficient documentation

## 2021-02-20 DIAGNOSIS — M549 Dorsalgia, unspecified: Secondary | ICD-10-CM | POA: Insufficient documentation

## 2021-02-20 DIAGNOSIS — R1030 Lower abdominal pain, unspecified: Secondary | ICD-10-CM | POA: Insufficient documentation

## 2021-02-20 LAB — COMPREHENSIVE METABOLIC PANEL
ALT: 15 U/L (ref 0–44)
AST: 19 U/L (ref 15–41)
Albumin: 3.6 g/dL (ref 3.5–5.0)
Alkaline Phosphatase: 46 U/L (ref 38–126)
Anion gap: 9 (ref 5–15)
BUN: 9 mg/dL (ref 6–20)
CO2: 21 mmol/L — ABNORMAL LOW (ref 22–32)
Calcium: 8.7 mg/dL — ABNORMAL LOW (ref 8.9–10.3)
Chloride: 104 mmol/L (ref 98–111)
Creatinine, Ser: 0.9 mg/dL (ref 0.44–1.00)
GFR, Estimated: >60 mL/min (ref >60)
Glucose, Bld: 97 mg/dL (ref 70–99)
Potassium: 3.3 mmol/L — ABNORMAL LOW (ref 3.5–5.1)
Sodium: 134 mmol/L — ABNORMAL LOW (ref 135–145)
Total Bilirubin: 0.8 mg/dL (ref 0.3–1.2)
Total Protein: 6.9 g/dL (ref 6.5–8.1)

## 2021-02-20 LAB — CBC
HCT: 35.2 % — ABNORMAL LOW (ref 36.0–46.0)
Hemoglobin: 12 g/dL (ref 12.0–15.0)
MCH: 31.9 pg (ref 26.0–34.0)
MCHC: 34.1 g/dL (ref 30.0–36.0)
MCV: 93.6 fL (ref 80.0–100.0)
Platelets: 337 10*3/uL (ref 150–400)
RBC: 3.76 MIL/uL — ABNORMAL LOW (ref 3.87–5.11)
RDW: 13.3 % (ref 11.5–15.5)
WBC: 13.7 10*3/uL — ABNORMAL HIGH (ref 4.0–10.5)
nRBC: 0 % (ref 0.0–0.2)

## 2021-02-20 LAB — LIPASE, BLOOD: Lipase: 37 U/L (ref 11–51)

## 2021-02-20 MED ORDER — ACETAMINOPHEN 325 MG PO TABS
650.0000 mg | ORAL_TABLET | Freq: Once | ORAL | Status: AC | PRN
  Administered 2021-02-20: 650 mg via ORAL
  Filled 2021-02-20: qty 2

## 2021-02-20 MED ORDER — ACETAMINOPHEN 500 MG PO TABS: 1000.0000 mg | ORAL_TABLET | Freq: Once | ORAL | Status: DC

## 2021-02-20 NOTE — ED Triage Notes (Signed)
Pt to ED for headache, back pain and lower abd pain that started today, febrile on arrival. States head only hurts when standing. Denies cough, sore throat, runny nose. NAD noted.

## 2021-03-24 ENCOUNTER — Emergency Department
Admission: EM | Admit: 2021-03-24 | Discharge: 2021-03-24 | Disposition: A | Discharge status: Home / Self Care | Payer: Medicaid Other | Attending: Emergency Medicine | Admitting: Emergency Medicine

## 2021-03-24 ENCOUNTER — Encounter: Payer: Self-pay | Admitting: Physician Assistant

## 2021-03-24 ENCOUNTER — Other Ambulatory Visit: Payer: Self-pay

## 2021-03-24 DIAGNOSIS — W25XXXA Contact with sharp glass, initial encounter: Secondary | ICD-10-CM | POA: Insufficient documentation

## 2021-03-24 DIAGNOSIS — S0181XA Laceration without foreign body of other part of head, initial encounter: Secondary | ICD-10-CM | POA: Insufficient documentation

## 2021-03-24 DIAGNOSIS — Z23 Encounter for immunization: Secondary | ICD-10-CM | POA: Insufficient documentation

## 2021-03-24 DIAGNOSIS — F17210 Nicotine dependence, cigarettes, uncomplicated: Secondary | ICD-10-CM | POA: Insufficient documentation

## 2021-03-24 MED ORDER — TETANUS-DIPHTH-ACELL PERTUSSIS 5-2.5-18.5 LF-MCG/0.5 IM SUSY
0.5000 mL | PREFILLED_SYRINGE | Freq: Once | INTRAMUSCULAR | Status: AC
  Administered 2021-03-24: 0.5 mL via INTRAMUSCULAR
  Filled 2021-03-24: qty 0.5

## 2021-03-24 MED ORDER — LIDOCAINE-EPINEPHRINE-TETRACAINE (LET) TOPICAL GEL
3.0000 mL | Freq: Once | TOPICAL | Status: AC
  Administered 2021-03-24: 3 mL via TOPICAL
  Filled 2021-03-24: qty 3

## 2021-03-24 MED ORDER — ACETAMINOPHEN 325 MG PO TABS
650.0000 mg | ORAL_TABLET | Freq: Once | ORAL | Status: AC
  Administered 2021-03-24: 650 mg via ORAL
  Filled 2021-03-24: qty 2
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
## 2021-03-24 NOTE — ED Notes (Signed)
BPD with patient taking report

## 2021-03-31 ENCOUNTER — Encounter: Payer: Self-pay | Admitting: Emergency Medicine

## 2021-03-31 ENCOUNTER — Other Ambulatory Visit: Payer: Self-pay

## 2021-03-31 ENCOUNTER — Emergency Department
Admission: EM | Admit: 2021-03-31 | Discharge: 2021-03-31 | Disposition: A | Discharge status: Home / Self Care | Payer: Medicaid Other | Attending: Emergency Medicine | Admitting: Emergency Medicine

## 2021-03-31 DIAGNOSIS — F17210 Nicotine dependence, cigarettes, uncomplicated: Secondary | ICD-10-CM | POA: Insufficient documentation

## 2021-03-31 DIAGNOSIS — S0181XD Laceration without foreign body of other part of head, subsequent encounter: Secondary | ICD-10-CM

## 2021-03-31 DIAGNOSIS — W228XXD Striking against or struck by other objects, subsequent encounter: Secondary | ICD-10-CM | POA: Insufficient documentation

## 2021-03-31 DIAGNOSIS — Z4802 Encounter for removal of sutures: Secondary | ICD-10-CM

## 2021-03-31 MED ORDER — BACITRACIN-NEOMYCIN-POLYMYXIN 400-5-5000 EX OINT
TOPICAL_OINTMENT | Freq: Once | CUTANEOUS | Status: AC
  Administered 2021-03-31: 1 via TOPICAL
  Filled 2021-03-31: qty 1

## 2021-03-31 NOTE — ED Triage Notes (Signed)
Pt here to have sutures removed from forehead. Pt reports no concern for infection. States that the area itches.
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
## 2021-03-31 NOTE — Discharge Instructions (Addendum)
You were seen today to have your sutures removed.  Please allow this area to heal further and try to avoid disrupting the scab.  You may apply Neosporin or Mederma OTC as needed to help with the healing process.

## 2021-05-24 ENCOUNTER — Ambulatory Visit: Payer: Self-pay | Admitting: Physician Assistant

## 2021-05-24 ENCOUNTER — Other Ambulatory Visit: Payer: Self-pay

## 2021-05-24 DIAGNOSIS — B9689 Other specified bacterial agents as the cause of diseases classified elsewhere: Secondary | ICD-10-CM

## 2021-05-24 DIAGNOSIS — Z113 Encounter for screening for infections with a predominantly sexual mode of transmission: Secondary | ICD-10-CM

## 2021-05-24 DIAGNOSIS — N760 Acute vaginitis: Secondary | ICD-10-CM

## 2021-05-24 LAB — WET PREP FOR TRICH, YEAST, CLUE
Trichomonas Exam: NEGATIVE
Yeast Exam: NEGATIVE

## 2021-05-28 ENCOUNTER — Encounter: Payer: Self-pay | Admitting: Physician Assistant

## 2021-05-28 MED ORDER — METRONIDAZOLE 500 MG PO TABS: 500.0000 mg | ORAL_TABLET | Freq: Two times a day (BID) | ORAL | 0 refills | Status: AC

## 2021-05-28 NOTE — Progress Notes (Signed)
[facility] Department STI clinic/screening visit  Subjective:  Abigail [name] is a 30 y.o. female being seen today for an STI screening visit. The patient reports they do have symptoms.  Patient reports that they do not desire a pregnancy in the next year.   They reported they are not interested in discussing contraception today.  No LMP recorded.   Patient has the following medical conditions:   Patient Active Problem List   Diagnosis Date Noted   Morbid obesity ([facility]) 220 lbs 11/14/2020   Marijuana use daily 11/14/2020   H/O tubal ligation 2016 11/14/2020   Smoker 1/2 ppd 11/14/2020   Vapes nicotine containing substance 11/14/2020   Lumbago 06/18/2015    Chief Complaint  Patient presents with   SEXUALLY TRANSMITTED DISEASE    screening    HPI  Patient reports that she has had a vaginal odor for 2 weeks.  Denies chronic conditions and regular medicines.  States that she has had a BTL for her BCM and LMP was 04/30/2021.  Last HIV test was 12/2020 and last pap was also in 2022.  See flowsheet for further details and programmatic requirements.    The following portions of the patient's history were reviewed and updated as appropriate: allergies, current medications, past medical history, past social history, past surgical history and problem list.  Objective:  There were no vitals filed for this visit.  Physical Exam Constitutional:      General: She is not in acute distress.    Appearance: Normal appearance.  HENT:     Head: Normocephalic and atraumatic.     Comments: No nits,lice, or hair loss. No cervical, supraclavicular or axillary adenopathy.     Mouth/Throat:     Mouth: Mucous membranes are moist.     Pharynx: Oropharynx is clear. No oropharyngeal exudate or posterior oropharyngeal erythema.  Eyes:     Conjunctiva/sclera: Conjunctivae normal.  Pulmonary:     Effort: Pulmonary effort is normal.  Abdominal:     Palpations: Abdomen is soft. There is  no mass.     Tenderness: There is no abdominal tenderness. There is no guarding or rebound.  Genitourinary:    General: Normal vulva.     Rectum: Normal.     Comments: External genitalia/pubic area without nits, lice, edema, erythema, lesions and inguinal adenopathy. Vagina with normal mucosa and small amount of thin, white discharge, pH=4.5. Cervix without visible lesions. Uterus firm, mobile, nt, no masses, no CMT, no adnexal tenderness or fullness.  Musculoskeletal:     Cervical back: Neck supple. No tenderness.  Skin:    General: Skin is warm and dry.     Findings: No bruising, erythema, lesion or rash.  Neurological:     Mental Status: She is alert and oriented to person, place, and time.  Psychiatric:        Mood and Affect: Mood normal.        Behavior: Behavior normal.        Thought Content: Thought content normal.        Judgment: Judgment normal.     Assessment and Plan:  TYSHELL RAMBERG is a 30 y.o. female presenting to the [facility] Department for STI screening  1. Screening for STD (sexually transmitted disease) Patient into clinic with symptoms. Reviewed with patient wet mount results.  Rec condoms with all sex. Await test results.  Counseled that RN will call if needs to RTC for treatment once results are back.  - WET  PREP FOR TRICH, YEAST, CLUE - Gonococcus culture - Chlamydia/Gonorrhea [place] [facility] - HIV [place] [facility] - Syphilis Serology, [facility]  2. BV (bacterial vaginosis) Treat BV with Metronidazole 500 mg #14 1 po BID for 7 days with food, no EtOH for 24 hr before and until 72 hr after completing medicine. No sex for 10 days. Enc to use OTC antifungal cream if has itching during or just after antibiotic use.  - metroNIDAZOLE (FLAGYL) 500 MG tablet; Take 1 tablet (500 mg total) by mouth 2 (two) times daily for 7 days.  Dispense: 14 tablet; Refill: 0     No follow-ups on file.  No future appointments.  Matt Holmes, PA

## 2021-05-30 LAB — GONOCOCCUS CULTURE

## 2021-06-02 NOTE — Progress Notes (Signed)
Chart reviewed by Pharmacist  Suzanne Walker PharmD, Contract Pharmacist at [facility]  

## 2021-10-02 ENCOUNTER — Emergency Department: Payer: BLUE CROSS/BLUE SHIELD

## 2021-10-02 ENCOUNTER — Other Ambulatory Visit: Payer: Self-pay

## 2021-10-02 ENCOUNTER — Emergency Department
Admission: EM | Admit: 2021-10-02 | Discharge: 2021-10-02 | Disposition: A | Discharge status: Home / Self Care | Payer: BLUE CROSS/BLUE SHIELD | Attending: Emergency Medicine | Admitting: Emergency Medicine

## 2021-10-02 ENCOUNTER — Encounter: Payer: Self-pay | Admitting: Emergency Medicine

## 2021-10-02 DIAGNOSIS — M545 Low back pain, unspecified: Secondary | ICD-10-CM | POA: Insufficient documentation

## 2021-10-02 DIAGNOSIS — F17210 Nicotine dependence, cigarettes, uncomplicated: Secondary | ICD-10-CM | POA: Diagnosis not present

## 2021-10-02 LAB — URINALYSIS, ROUTINE W REFLEX MICROSCOPIC
Bilirubin Urine: NEGATIVE
Glucose, UA: NEGATIVE mg/dL
Ketones, ur: NEGATIVE mg/dL
Nitrite: NEGATIVE
Protein, ur: NEGATIVE mg/dL
Specific Gravity, Urine: 1.018 (ref 1.005–1.030)
pH: 5 (ref 5.0–8.0)

## 2021-10-02 LAB — POC URINE PREG, ED: Preg Test, Ur: NEGATIVE

## 2021-10-02 MED ORDER — KETOROLAC TROMETHAMINE 30 MG/ML IJ SOLN
30.0000 mg | Freq: Once | INTRAMUSCULAR | Status: AC
  Administered 2021-10-02: 30 mg via INTRAMUSCULAR
  Filled 2021-10-02: qty 1

## 2021-10-02 MED ORDER — ETODOLAC 400 MG PO TABS: 400.0000 mg | ORAL_TABLET | Freq: Two times a day (BID) | ORAL | 0 refills | Status: DC

## 2021-10-02 NOTE — ED Triage Notes (Signed)
Pt comes into the ED via POV c/o low back pain that has been ongoing since January.  Pt denies any urinary symptoms.  Pt denies any known injury.  Pt ambulatory with good gait.  ?

## 2021-10-02 NOTE — ED Provider Notes (Signed)
? ?[facility] ?Provider Note ? ? ? Event Date/Time  ? First MD Initiated Contact with Patient 10/02/21 0756   ?  (approximate) ? ? ?History  ? ?Back Pain ? ? ?HPI ? ?Abigail [name] is a 31 y.o. female   to the ED with complaint of low back pain that has been going on since January (2-1/2 months) without history of known injury.  Patient denies any previous injury to her back.  She states she has taken over-the-counter medication with minimal relief.  She denies any urinary symptoms or history of prior kidney stones.  Patient reports that she does a lot of lifting at work.  Patient has a history of endometriosis and ovarian cysts and currently smokes 1/2 pack cigarettes per day.  Currently she rates her pain as 10/10. ? ?  ? ? ?Physical Exam  ? ?Triage Vital Signs: ?ED Triage Vitals  ?Enc Vitals Group  ?   BP 10/02/21 0748 114/79  ?   Pulse Rate 10/02/21 0748 71  ?   Resp 10/02/21 0748 18  ?   Temp 10/02/21 0748 98.2 ?F (36.8 ?C)  ?   Temp Source 10/02/21 0748 Oral  ?   SpO2 10/02/21 0748 100 %  ?   Weight 10/02/21 0744 192 lb 7.4 oz (87.3 kg)  ?   Height 10/02/21 0744 5\' 5"  (1.651 m)  ?   Head Circumference --   ?   Peak Flow --   ?   Pain Score 10/02/21 0744 10  ?   Pain Loc --   ?   Pain Edu? --   ?   Excl. in [place]? --   ? ? ?Most recent vital signs: ?Vitals:  ? 10/02/21 0748  ?BP: 114/79  ?Pulse: 71  ?Resp: 18  ?Temp: 98.2 ?F (36.8 ?C)  ?SpO2: 100%  ? ? ? ?General: Awake, no distress.  ?CV:  Good peripheral perfusion.  Heart regular rate and rhythm without murmur. ?Resp:  Normal effort.  Lungs are clear bilaterally. ?Abd:  No distention.  ?Other:  Examination of the back there is no gross deformity.  There is diffuse tenderness on palpation of the lumbar spine starting at approximately L2-L3 and going to the sacral area.  Paravertebral muscles are tender bilaterally.  Good muscle strength bilaterally and patient is ambulatory without any assistance. ? ? ?ED Results / Procedures / Treatments   ? ?Labs ?(all labs ordered are listed, but only abnormal results are displayed) ?Labs Reviewed  ?URINALYSIS, ROUTINE W REFLEX MICROSCOPIC - Abnormal; Notable for the following components:  ?    Result Value  ? Color, Urine YELLOW (*)   ? APPearance HAZY (*)   ? Hgb urine dipstick SMALL (*)   ? Leukocytes,Ua TRACE (*)   ? Bacteria, UA RARE (*)   ? All other components within normal limits  ?POC URINE PREG, ED  ? ? ? ?RADIOLOGY ?Lumbar spine x-ray images were reviewed and no compression fracture was noted.  Radiology report shows mild degenerative changes but no acute bony injuries. ? ? ? ?PROCEDURES: ? ?Critical Care performed:  ? ?Procedures ? ? ?MEDICATIONS ORDERED IN ED: ?Medications  ?ketorolac (TORADOL) 30 MG/ML injection 30 mg (30 mg Intramuscular Given 10/02/21 0854)  ? ? ? ?IMPRESSION / MDM / ASSESSMENT AND PLAN / ED COURSE  ?I reviewed the triage vital signs and the nursing notes. ? ? ?Differential diagnosis includes, but is not limited to, low back pain, lumbar strain, urinary tract infection. ? ?  31 year old female presents to the ED with complaint of low back pain that began when she woke up this morning.  She has not taken any over-the-counter medications.  Patient was given Toradol 30 mg IM while in the ED while waiting for a urinalysis report.  Urinalysis showed 6-10 WBCs and rare bacteria.  Lumbar spine x-rays were negative for any acute changes but did show some mild degenerative changes that patient was made aware.  Patient was feeling somewhat better with the Toradol injection and with range of motion which reproduces her pain it was felt that this is more muscle skeletal as she does not have any urinary symptoms.  A prescription for etodolac 400 mg twice daily was sent to the pharmacy and she is to continue with this.  She is also encouraged to use ice or heat to her back as needed for discomfort and follow-up with her PCP or urgent care if any continued problems. ? ? ? ?FINAL CLINICAL  IMPRESSION(S) / ED DIAGNOSES  ? ?Final diagnoses:  ?Bilateral low back pain without sciatica, unspecified chronicity  ? ? ? ?Rx / DC Orders  ? ?ED Discharge Orders   ? ?      Ordered  ?  etodolac (LODINE) 400 MG tablet  2 times daily       ? 10/02/21 0943  ? ?  ?  ? ?  ? ? ? ?Note:  This document was prepared using Dragon voice recognition software and may include unintentional dictation errors. ?  ?Tommi Rumps, PA-C ?10/02/21 1541 ? ?  ?Sharyn Creamer, MD ?10/02/21 1702 ? ?

## 2021-10-02 NOTE — ED Notes (Signed)
34 yof with lower back pain since Jan. Denies any injury or history.  ?

## 2021-10-02 NOTE — Discharge Instructions (Signed)
Follow-up with your primary care provider if any continued problems or concerns.  If you do not have a primary care provider you may follow-up with an urgent care.  A prescription for etodolac 400 mg was sent to the pharmacy to take twice a day with food.  You may also use ice or heat to your back now for comfort.  Back x-ray did show some very mild early degenerative changes and it is important to keep your core muscles strengthen which will help with your back. ?

## 2021-10-24 ENCOUNTER — Encounter: Payer: Self-pay | Admitting: Family Medicine

## 2021-10-24 ENCOUNTER — Ambulatory Visit: Payer: Self-pay | Admitting: Family Medicine

## 2021-10-24 DIAGNOSIS — N760 Acute vaginitis: Secondary | ICD-10-CM

## 2021-10-24 DIAGNOSIS — Z113 Encounter for screening for infections with a predominantly sexual mode of transmission: Secondary | ICD-10-CM

## 2021-10-24 DIAGNOSIS — B9689 Other specified bacterial agents as the cause of diseases classified elsewhere: Secondary | ICD-10-CM

## 2021-10-24 LAB — HEPATITIS B SURFACE ANTIGEN: Hepatitis B Surface Ag: NONREACTIVE

## 2021-10-24 LAB — HM HIV SCREENING LAB: HM HIV Screening: NEGATIVE

## 2021-10-24 LAB — WET PREP FOR TRICH, YEAST, CLUE
Trichomonas Exam: NEGATIVE
Yeast Exam: NEGATIVE

## 2021-10-24 LAB — HM HEPATITIS C SCREENING LAB: HM Hepatitis Screen: NEGATIVE

## 2021-10-24 MED ORDER — METRONIDAZOLE 500 MG PO TABS
500.0000 mg | ORAL_TABLET | Freq: Two times a day (BID) | ORAL | 0 refills | Status: AC
Start: 2021-10-24 — End: 2021-10-31

## 2021-10-24 NOTE — Patient Instructions (Signed)
Steps to prevent BV and yeast: ?Wear all-cotton underwear ?Sleep without underwear ?Take showers instead of baths ?Wear loose fitting clothing, especially during warm/hot weather ?Use a hair dryer on low after bathing to dry the area ?Avoid scented soaps and body washes ?Do not douche ?May try over the counter probiotics or boric acid gel or suppositories ?Stop smoking Natural Remedies for Bacterial Vaginosis ? ?Option #1 ?1 Tbsp Fractitionated Coconut Oil ?10 drops of Melaleuca (Tea Tree) Oil ? ?Mix ingredients together well.  Soak 3-4 tampons (in applicators) in that mixture until all or mostly all mixture is soaked up into the tampons.  Insert 1 saturated tampon vaginally and wear overnight for 3-4 nights.   ? ?Option #2 (sometimes to be used in conjunction with option #1) ?Fill tub with enough to cover lap/lower abdomen warm water.  Mix 1/2 cup of baking soda in water.  Soak in water/baking soda mixture for at least 20 minutes.  Be sure to swish water in between legs to get as much in vagina as possible.  This soak should be done after sexual intercourse and menstrual cycles.   ? ? ?Option #3 (sometimes to be used in conjunction with option #1 and 2) ?Fill tub with enough to cover lap/lower abdomen warm water.  Mix 2-4 cup of apple cider vinegar in water.  Soak in water/vinegar mixture for at least 20 minutes.  Be sure to swish water in between legs to get as much in vagina as possible.  This soak should be done after sexual intercourse and menstrual cycles.   ? ?GO WHITE: ?Soap: UNSCENTED Dove (white box light green writing) ?Laundry detergent (underwear)- Dreft or [employer] unscented ?WHITE 100% cotton panties (NOT just cotton crouch) ?Sanitary napkin/panty liners: UNSCENTED.  If it doesn't SAY unscented it can have a scent/perfume    ?NO PERFUMES OR LOTIONS OR POTIONS in the vulvar area (may use regular [place]) ?Condoms: hypoallergenic only. Non dyed (no color) ?Toilet papers: white only ?Wash clothes: use a  separate wash cloth. WHITE.  Wash in [place].  ? ?You can purchase Tea Tree Oil locally at: ? ?[employer] ?[address] ?[place] [address] ? ?[employer] ?[address] ?[place] [address] ? ?Advise that these alternatives will not replace the need to be evaluated if symptoms persist. You will need to seek care at an OB/GYN provider.  ?

## 2021-10-24 NOTE — Progress Notes (Signed)
Pt here for STI screening. Wet prep negative. Orders completed per Provider's orders.  Lethea Killings RN ?

## 2021-10-24 NOTE — Progress Notes (Signed)
[facility] Department ? ?STI clinic/screening visit ?[address] ?[place] [address] ?[phone] ? ?Subjective:  ?Abigail [name] is a 31 y.o. female being seen today for an STI screening visit. The patient reports they do have symptoms.  Patient reports that they do not desire a pregnancy in the next year.   They reported they are not interested in discussing contraception today.   ? ?Patient's last menstrual period was 10/03/2021. ? ? ?Patient has the following medical conditions:   ?Patient Active Problem List  ? Diagnosis Date Noted  ? Morbid obesity (HCC) 220 lbs 11/14/2020  ? Marijuana use daily 11/14/2020  ? H/O tubal ligation 2016 11/14/2020  ? Smoker 1/2 ppd 11/14/2020  ? Vapes nicotine containing substance 11/14/2020  ? Lumbago 06/18/2015  ? ? ?Chief Complaint  ?Patient presents with  ? SEXUALLY TRANSMITTED DISEASE  ?  STI screening. States she is having a fishy odor related to intercourse  ? ? ?HPI ? ?Patient reports here for screening, reports s/sx  ? ?Last HIV test per patient/review of record was 05/24/2021 ?Patient reports last pap was 02/17/2020.  ? ?Screening for MPX risk: ?Does the patient have an unexplained rash? No ?Is the patient MSM? No ?Does the patient endorse multiple sex partners or anonymous sex partners? No ?Did the patient have close or sexual contact with a person diagnosed with MPX? No ?Has the patient traveled outside the [place] where MPX is endemic? No ?Is there a high clinical suspicion for MPX-- evidenced by one of the following No ? -Unlikely to be chickenpox ? -Lymphadenopathy ? -Rash that present in same phase of evolution on any given body part ?See flowsheet for further details and programmatic requirements.  ? ? ?The following portions of the patient's history were reviewed and updated as appropriate: allergies, current medications, past medical history, past social history, past surgical history and problem list. ? ?Objective:  ?There were no  vitals filed for this visit. ? ?Physical Exam ?Vitals and nursing note reviewed.  ?Constitutional:   ?   Appearance: Normal appearance.  ?HENT:  ?   Head: Normocephalic and atraumatic.  ?   Mouth/Throat:  ?   Mouth: Mucous membranes are moist.  ?   Pharynx: Oropharynx is clear. No oropharyngeal exudate or posterior oropharyngeal erythema.  ?Pulmonary:  ?   Effort: Pulmonary effort is normal.  ?Abdominal:  ?   General: Abdomen is flat.  ?   Palpations: There is no mass.  ?   Tenderness: There is no abdominal tenderness. There is no rebound.  ?Genitourinary: ?   General: Normal vulva.  ?   Exam position: Lithotomy position.  ?   Pubic Area: No rash or pubic lice.   ?   Labia:     ?   Right: No rash or lesion.     ?   Left: No rash or lesion.   ?   Vagina: Normal. No vaginal discharge, erythema, bleeding or lesions.  ?   Cervix: No cervical motion tenderness, discharge, friability, lesion or erythema.  ?   Uterus: Normal.   ?   Adnexa: Right adnexa normal and left adnexa normal.  ?   Rectum: Normal.  ?   Comments: External genitalia without, lice, nits, erythema, edema , lesions or inguinal adenopathy. Vagina with normal mucosa and discharge and pH >4.  Cervix without visual lesions, uterus firm, mobile, non-tender, no masses, CMT adnexal fullness or tenderness.  ? ?Odor present  ?Lymphadenopathy:  ?  Head:  ?   Right side of head: No preauricular or posterior auricular adenopathy.  ?   Left side of head: No preauricular or posterior auricular adenopathy.  ?   Cervical: No cervical adenopathy.  ?   Upper Body:  ?   Right upper body: No supraclavicular or axillary adenopathy.  ?   Left upper body: No supraclavicular or axillary adenopathy.  ?   Lower Body: No right inguinal adenopathy. No left inguinal adenopathy.  ?Skin: ?   General: Skin is warm and dry.  ?   Findings: No rash.  ?Neurological:  ?   Mental Status: She is alert and oriented to person, place, and time.  ?Psychiatric:     ?   Mood and Affect: Mood  normal.     ?   Behavior: Behavior normal.  ? ? ? ?Assessment and Plan:  ?Abigail [name] is a 31 y.o. female presenting to the [facility] Department for STI screening ? ?1. Screening examination for venereal disease ?Patient accepted all screenings including wet prep, oral, vaginal CT/GC and bloodwork for HIV/RPR.  ?Patient meets criteria for HepB screening? Yes. Ordered? Yes ?Patient meets criteria for HepC screening? Yes. Ordered? Yes ? ?Wet prep results neg    ?Treatment needed  ?Discussed time line for State [facility] results and that patient will be called with positive results and encouraged patient to call if she had not heard in 2 weeks.  ?Counseled to return or seek care for continued or worsening symptoms ?Recommended condom use with all sex ? ?Patient is currently using  BTL  to prevent pregnancy.   ?- Chlamydia/Gonorrhea [facility] ?- HBV Antigen/Antibody [facility] ?- HIV/HCV [place] [facility] ?- Syphilis Serology, [facility] ?- WET PREP FOR TRICH, YEAST, CLUE ?- Chlamydia/Gonorrhea [place] [facility] ? ?2. Bacterial vaginosis ?Treat d/t assessment and reported s/sx  ?- metroNIDAZOLE (FLAGYL) 500 MG tablet; Take 1 tablet (500 mg total) by mouth 2 (two) times daily for 7 days.  Dispense: 14 tablet; Refill: 0 ? ? ? ? ?Return for as needed. ? ?No future appointments. ? ?Wendi Snipes, FNP ? ?

## 2022-03-28 IMAGING — US US PELVIS COMPLETE TRANSABD/TRANSVAG W DUPLEX
2 series · 13 of 25 positions shown · non-contrast
Comparison: 03/08/2020

CLINICAL DATA: Lower abdominal pain, vaginal discharge

EXAM:
TRANSABDOMINAL AND TRANSVAGINAL ULTRASOUND OF PELVIS
DOPPLER ULTRASOUND OF OVARIES
TECHNIQUE: Both transabdominal and transvaginal ultrasound examinations of the
pelvis were performed. Transabdominal technique was performed for
global imaging of the pelvis including uterus, ovaries, adnexal
regions, and pelvic cul-de-sac.
It was necessary to proceed with endovaginal exam following the
transabdominal exam to visualize the ovaries bilaterally. Color and
duplex Doppler ultrasound was utilized to evaluate blood flow to the
ovaries.

[Series 1: us pelvic complete w transvaginal and torsion righ · 12 of 121 slices shown]
[im 1/121]
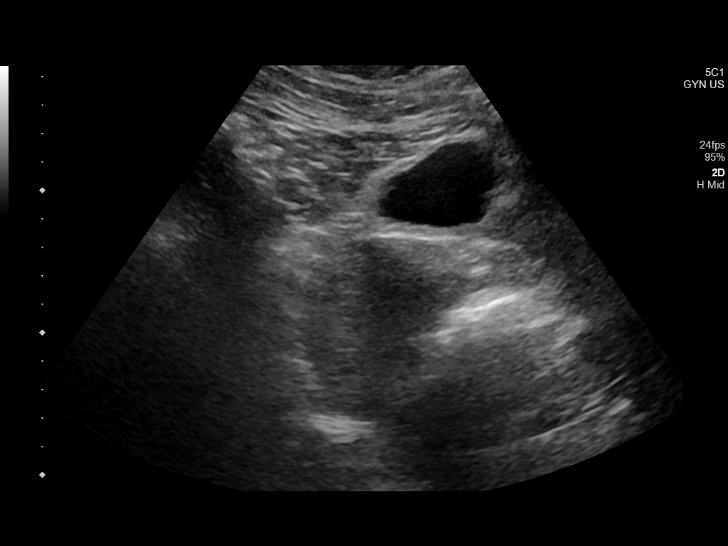
[im 11/121]
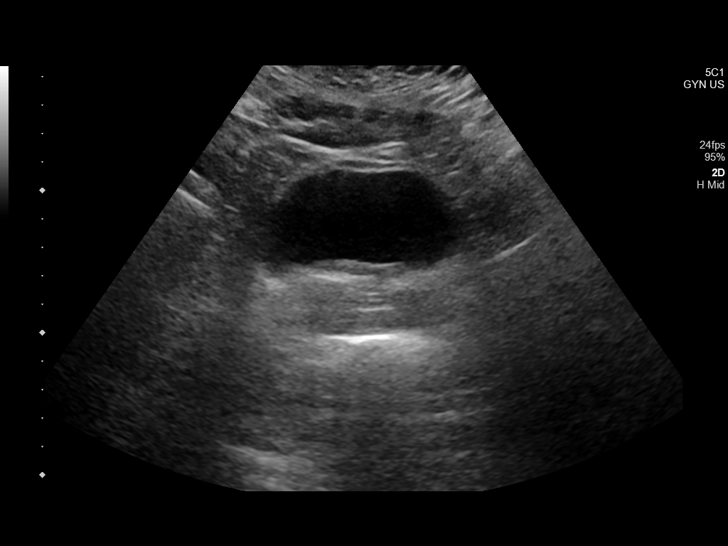
[im 21/121]
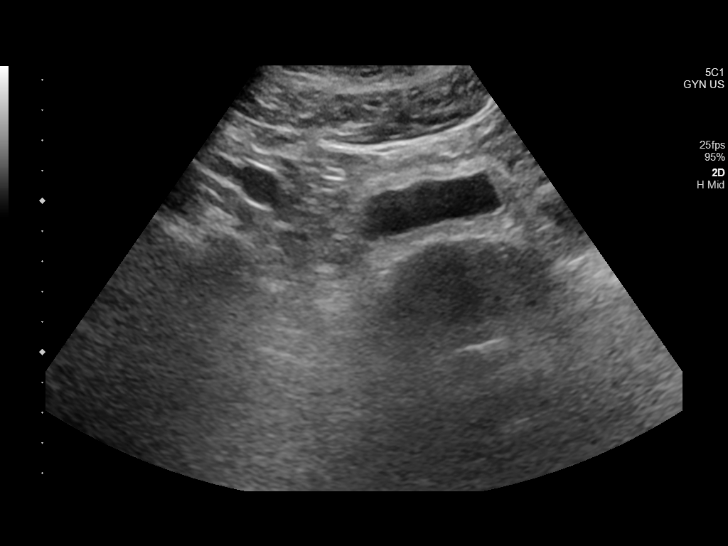
[im 32/121]
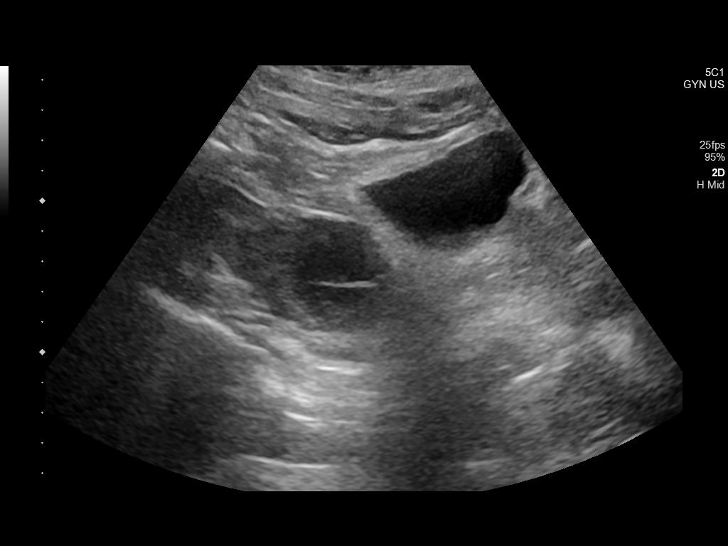
[im 42/121]
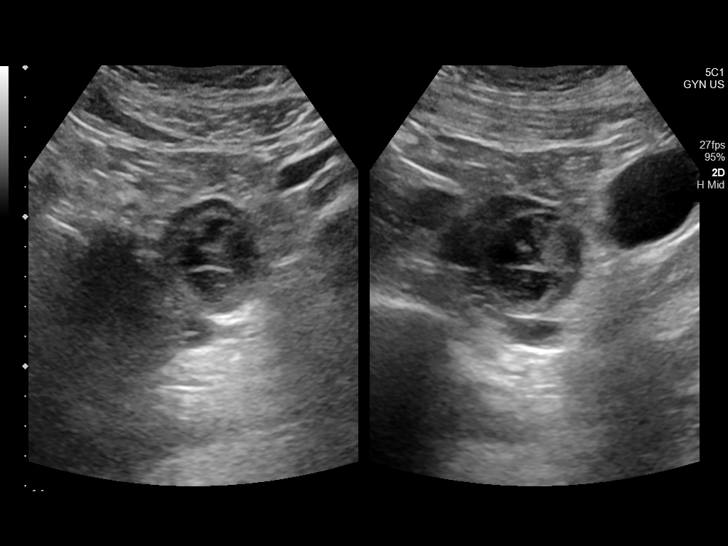
[im 53/121]
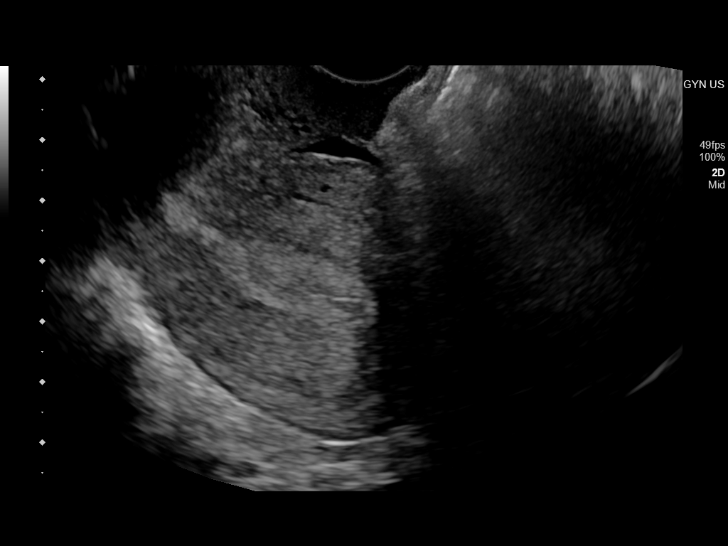
[im 63/121]
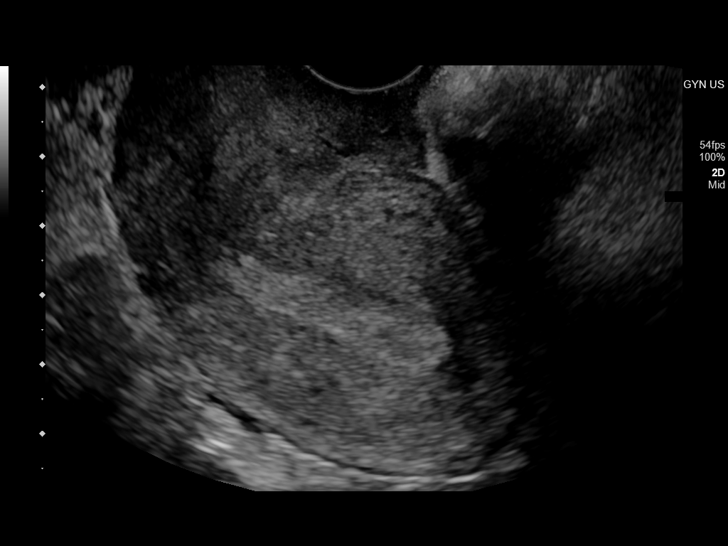
[im 74/121]
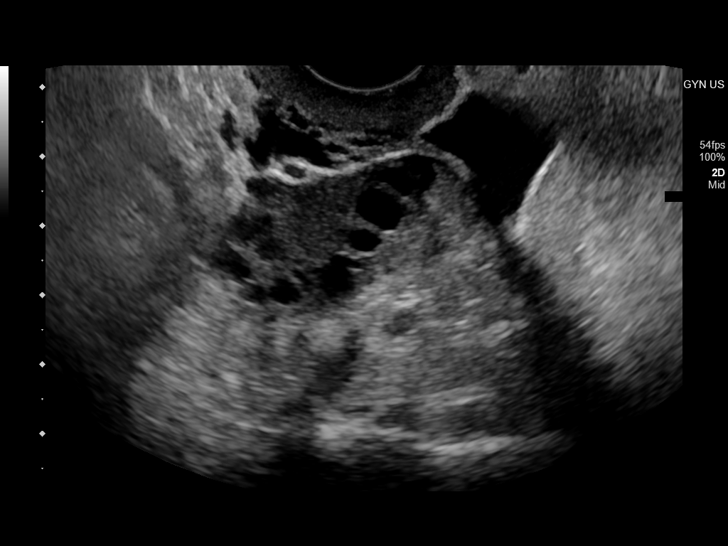
[im 84/121]
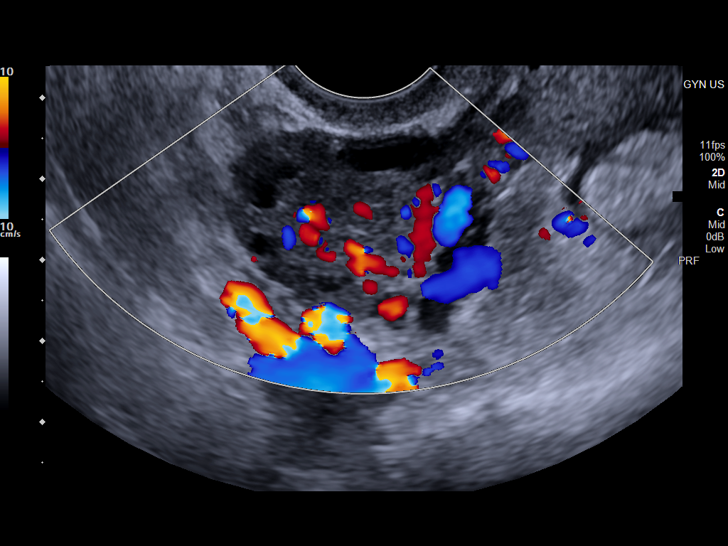
[im 94/121]
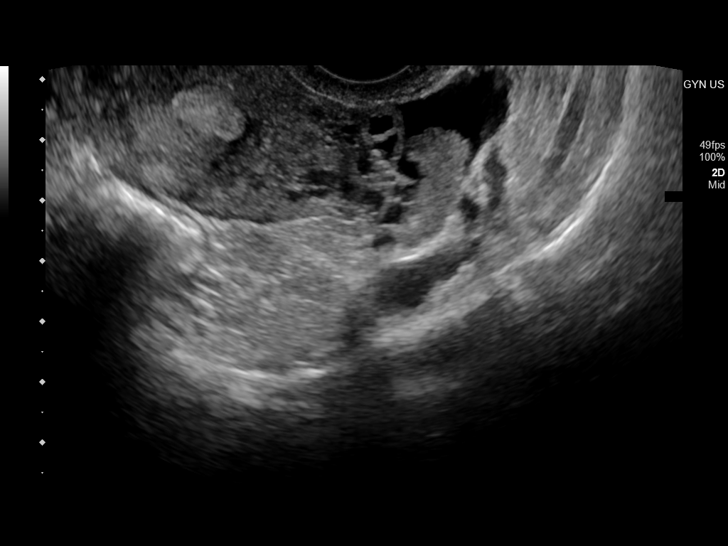
[im 105/121]
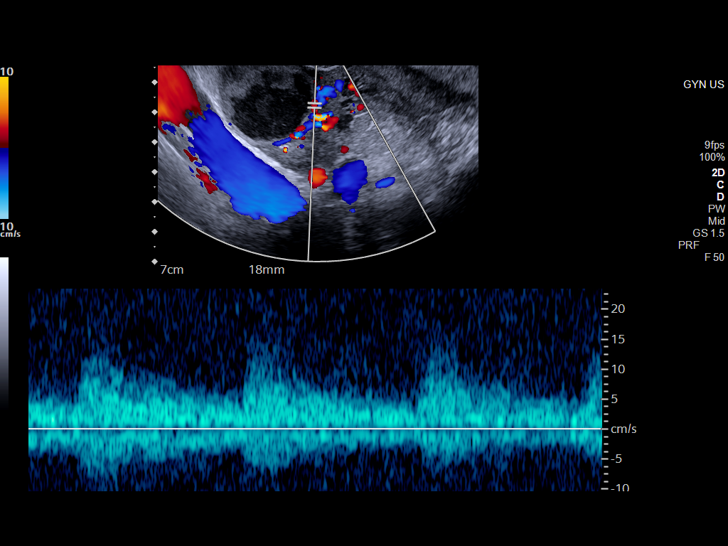
[im 115/121]
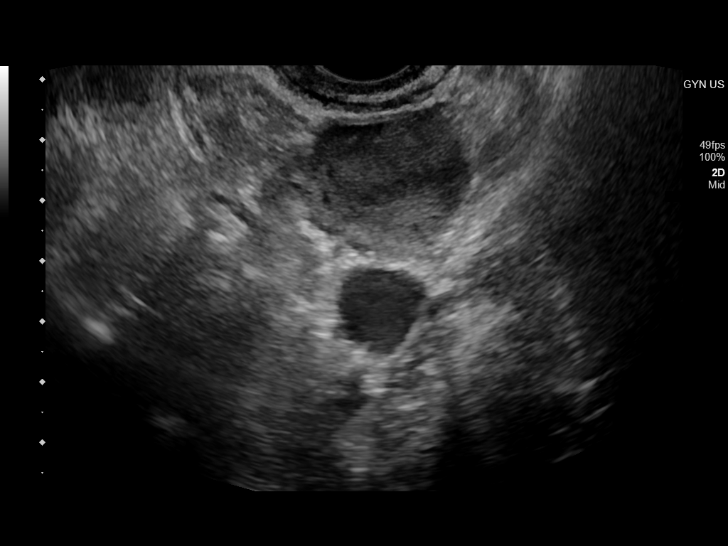

[Series 1001: gyn us · 1 of 2 slices shown]
[im 1/2]
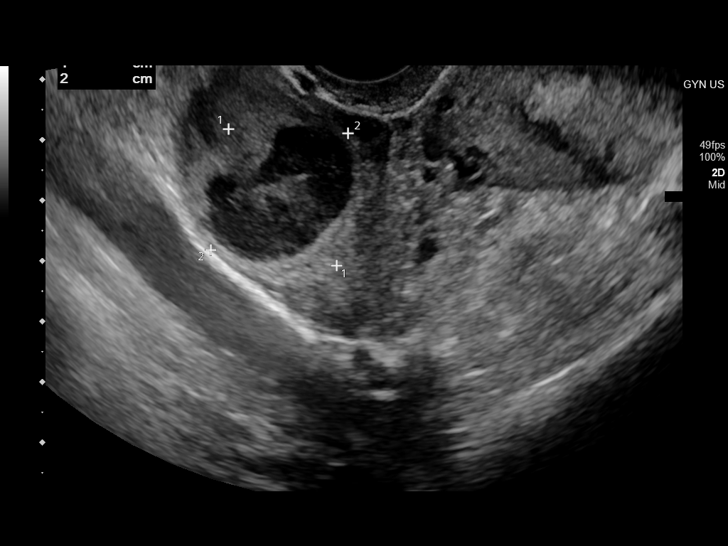

[13 of 25 positions shown; findings below may reference images not displayed]

FINDINGS: Uterus

Measurements: 7.1 x 4.0 x 5.8 cm = volume: 87 mL. The uterus is
retroverted. No intrauterine masses are seen. The cervix is
unremarkable.

Endometrium

Thickness: 9 mm.  No focal abnormality visualized.

Right ovary

Measurements: 4.1 x 2.5 x 3.2 cm = volume: 17 mL. Normal
appearance/no adnexal mass.

Left ovary

Measurements: 5.5 x 3.2 x 3.9 cm = volume: 36 mL. Since the prior
examination, there has developed a complex cystic lesion within the
left ovary measuring 3.0 x 3.0 x 3.2 cm demonstrating internal
avascular echogenic debris, irregular, noncalcified septation and
avascular mural nodularity. Given its relatively rapid development
since prior examination and lack of significant vascularity, this
most likely represents a hemorrhagic cyst.

Pulsed Doppler evaluation of both ovaries demonstrates normal
low-resistance arterial and venous waveforms.

Other findings

There is trace simple appearing free fluid adjacent to the ovaries
bilaterally.
IMPRESSION: Interval development of a 3 cm complex avascular cystic lesion most
likely representing a hemorrhagic cyst. A follow-up sonogram,
however, in 6-12 weeks, in the immediate post menstrual phase, would
be helpful in documenting resolution.

Otherwise normal pelvic sonogram

## 2022-04-16 ENCOUNTER — Encounter: Payer: Self-pay | Admitting: Advanced Practice Midwife

## 2022-04-16 ENCOUNTER — Ambulatory Visit: Payer: Self-pay

## 2022-04-16 ENCOUNTER — Ambulatory Visit: Payer: Medicaid Other | Admitting: Advanced Practice Midwife

## 2022-04-16 DIAGNOSIS — Z113 Encounter for screening for infections with a predominantly sexual mode of transmission: Secondary | ICD-10-CM

## 2022-04-16 DIAGNOSIS — A599 Trichomoniasis, unspecified: Secondary | ICD-10-CM | POA: Insufficient documentation

## 2022-04-16 DIAGNOSIS — T7412XS Child physical abuse, confirmed, sequela: Secondary | ICD-10-CM

## 2022-04-16 DIAGNOSIS — T7412XA Child physical abuse, confirmed, initial encounter: Secondary | ICD-10-CM | POA: Insufficient documentation

## 2022-04-16 DIAGNOSIS — F419 Anxiety disorder, unspecified: Secondary | ICD-10-CM

## 2022-04-16 LAB — HM HEPATITIS C SCREENING LAB: HM Hepatitis Screen: NEGATIVE

## 2022-04-16 LAB — HM HIV SCREENING LAB: HM HIV Screening: NEGATIVE

## 2022-04-16 LAB — WET PREP FOR TRICH, YEAST, CLUE
Trichomonas Exam: POSITIVE — AB
Yeast Exam: NEGATIVE

## 2022-04-16 MED ORDER — METRONIDAZOLE 500 MG PO TABS: 500.0000 mg | ORAL_TABLET | Freq: Two times a day (BID) | ORAL | 0 refills | Status: DC

## 2022-04-16 NOTE — Addendum Note (Signed)
Addended by: Alesia Banda A on: 04/16/2022 09:44 AM   Modules accepted: Orders
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
## 2022-04-16 NOTE — Progress Notes (Signed)
In-House Labs reviewed at visit. The patient was dispensed Metronidazole today. I provided counseling today regarding the medication. We discussed the medication, the side effects and when to call clinic. Patient given the opportunity to ask questions. Questions answered. BThiele

## 2022-04-20 LAB — GONOCOCCUS CULTURE

## 2022-04-29 ENCOUNTER — Encounter: Payer: Self-pay | Admitting: Emergency Medicine

## 2022-04-29 ENCOUNTER — Emergency Department
Admission: EM | Admit: 2022-04-29 | Discharge: 2022-04-29 | Disposition: A | Discharge status: Home / Self Care | Payer: BC Managed Care – PPO | Attending: Emergency Medicine | Admitting: Emergency Medicine

## 2022-04-29 ENCOUNTER — Other Ambulatory Visit: Payer: Self-pay

## 2022-04-29 ENCOUNTER — Emergency Department: Payer: BC Managed Care – PPO

## 2022-04-29 DIAGNOSIS — N939 Abnormal uterine and vaginal bleeding, unspecified: Secondary | ICD-10-CM | POA: Diagnosis present

## 2022-04-29 LAB — WET PREP, GENITAL
Clue Cells Wet Prep HPF POC: NONE SEEN
Sperm: NONE SEEN
Trich, Wet Prep: NONE SEEN
WBC, Wet Prep HPF POC: <10 (ref <10)
Yeast Wet Prep HPF POC: NONE SEEN

## 2022-04-29 LAB — BASIC METABOLIC PANEL
Anion gap: 4 — ABNORMAL LOW (ref 5–15)
BUN: 14 mg/dL (ref 6–20)
CO2: 25 mmol/L (ref 22–32)
Calcium: 9 mg/dL (ref 8.9–10.3)
Chloride: 110 mmol/L (ref 98–111)
Creatinine, Ser: 0.73 mg/dL (ref 0.44–1.00)
GFR, Estimated: >60 mL/min (ref >60)
Glucose, Bld: 94 mg/dL (ref 70–99)
Potassium: 4 mmol/L (ref 3.5–5.1)
Sodium: 139 mmol/L (ref 135–145)

## 2022-04-29 LAB — CBC WITH DIFFERENTIAL/PLATELET
Abs Immature Granulocytes: 0.02 10*3/uL (ref 0.00–0.07)
Abs Immature Granulocytes: 0.01 10*3/uL (ref 0.00–0.07)
Basophils Absolute: 0.1 10*3/uL (ref 0.0–0.1)
Basophils Absolute: 0.1 10*3/uL (ref 0.0–0.1)
Basophils Relative: 1 %
Basophils Relative: 1 %
Eosinophils Absolute: 0.1 10*3/uL (ref 0.0–0.5)
Eosinophils Absolute: 0.1 10*3/uL (ref 0.0–0.5)
Eosinophils Relative: 1 %
Eosinophils Relative: 1 %
HCT: 39.7 % (ref 36.0–46.0)
HCT: 38.2 % (ref 36.0–46.0)
Hemoglobin: 12.8 g/dL (ref 12.0–15.0)
Hemoglobin: 12.4 g/dL (ref 12.0–15.0)
Immature Granulocytes: 0 %
Immature Granulocytes: 0 %
Lymphocytes Relative: 37 %
Lymphocytes Relative: 42 %
Lymphs Abs: 2.2 10*3/uL (ref 0.7–4.0)
Lymphs Abs: 2.7 10*3/uL (ref 0.7–4.0)
MCH: 30.1 pg (ref 26.0–34.0)
MCH: 30.4 pg (ref 26.0–34.0)
MCHC: 32.2 g/dL (ref 30.0–36.0)
MCHC: 32.5 g/dL (ref 30.0–36.0)
MCV: 93.4 fL (ref 80.0–100.0)
MCV: 93.6 fL (ref 80.0–100.0)
Monocytes Absolute: 0.5 10*3/uL (ref 0.1–1.0)
Monocytes Absolute: 0.4 10*3/uL (ref 0.1–1.0)
Monocytes Relative: 8 %
Monocytes Relative: 7 %
Neutro Abs: 3.1 10*3/uL (ref 1.7–7.7)
Neutro Abs: 3.1 10*3/uL (ref 1.7–7.7)
Neutrophils Relative %: 53 %
Neutrophils Relative %: 49 %
Platelets: 347 10*3/uL (ref 150–400)
Platelets: 341 10*3/uL (ref 150–400)
RBC: 4.25 MIL/uL (ref 3.87–5.11)
RBC: 4.08 MIL/uL (ref 3.87–5.11)
RDW: 13 % (ref 11.5–15.5)
RDW: 12.8 % (ref 11.5–15.5)
WBC: 6 10*3/uL (ref 4.0–10.5)
WBC: 6.4 10*3/uL (ref 4.0–10.5)
nRBC: 0 % (ref 0.0–0.2)
nRBC: 0 % (ref 0.0–0.2)

## 2022-04-29 LAB — URINALYSIS, ROUTINE W REFLEX MICROSCOPIC
Bacteria, UA: NONE SEEN
Bilirubin Urine: NEGATIVE
Glucose, UA: NEGATIVE mg/dL
Ketones, ur: NEGATIVE mg/dL
Nitrite: NEGATIVE
Protein, ur: 30 mg/dL — AB
RBC / HPF: >50 RBC/hpf — ABNORMAL HIGH (ref 0–5)
Specific Gravity, Urine: 1.025 (ref 1.005–1.030)
pH: 6 (ref 5.0–8.0)

## 2022-04-29 LAB — POC URINE PREG, ED: Preg Test, Ur: NEGATIVE

## 2022-04-29 LAB — CHLAMYDIA/NGC RT PCR (ARMC ONLY)
Chlamydia Tr: NOT DETECTED
N gonorrhoeae: NOT DETECTED

## 2022-04-29 MED ORDER — NAPROXEN 500 MG PO TABS: 500.0000 mg | ORAL_TABLET | Freq: Two times a day (BID) | ORAL | 0 refills | Status: DC

## 2022-04-29 MED ORDER — ACETAMINOPHEN 325 MG PO TABS
650.0000 mg | ORAL_TABLET | Freq: Once | ORAL | Status: AC
  Administered 2022-04-29: 650 mg via ORAL
  Filled 2022-04-29: qty 2

## 2022-04-29 NOTE — ED Triage Notes (Addendum)
C/O vaginal bleeding x 2 days.  States last Tuesday started with spotting and then on Sunday bleeding started.  Currently changing pad every 90 minutes.

## 2022-04-29 NOTE — Discharge Instructions (Addendum)
Your ultrasound is normal.  You are not pregnant.  Your blood levels are normal.  Please return to the emergency department for any new, worsening, or change in symptoms or other concerns.  Please follow-up with your OB/GYN, and if you do not have an OB/GYN, then you may call the number provided.  It was a pleasure caring for you today.
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
## 2022-08-11 ENCOUNTER — Encounter: Payer: Self-pay | Admitting: Family

## 2022-08-11 ENCOUNTER — Ambulatory Visit: Payer: Medicaid Other | Admitting: Family

## 2022-08-11 DIAGNOSIS — Z113 Encounter for screening for infections with a predominantly sexual mode of transmission: Secondary | ICD-10-CM

## 2022-08-11 LAB — WET PREP FOR TRICH, YEAST, CLUE
Trichomonas Exam: NEGATIVE
Yeast Exam: NEGATIVE

## 2022-08-11 LAB — HM HIV SCREENING LAB: HM HIV Screening: NEGATIVE

## 2022-08-11 NOTE — Progress Notes (Signed)
Pt appointment for STI screenings. Seen by [place]. Initial lab results reviewed with pt.
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
## 2022-08-16 LAB — GONOCOCCUS CULTURE

## 2022-09-18 IMAGING — CT CT MAXILLOFACIAL W/O CM
3 series · 15 of 47 positions shown, 18 images · non-contrast
Comparison: None.

CLINICAL DATA: Right mandibular region pain

EXAM:
CT MAXILLOFACIAL WITHOUT CONTRAST
TECHNIQUE: Multidetector CT imaging of the maxillofacial structures was
performed. Multiplanar CT image reconstructions were also generated.

[Series 3: max soft · axial · 0.36mm/px · z∈[-149,-27]mm · 9 of 71 slices shown, 12 images]
[im 5/71  brain]
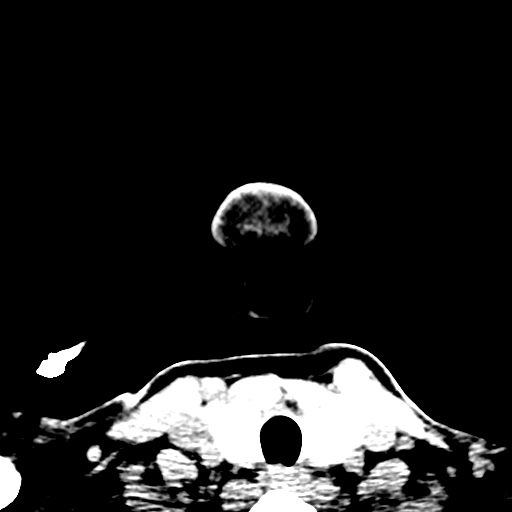
[im 5/71  bone]
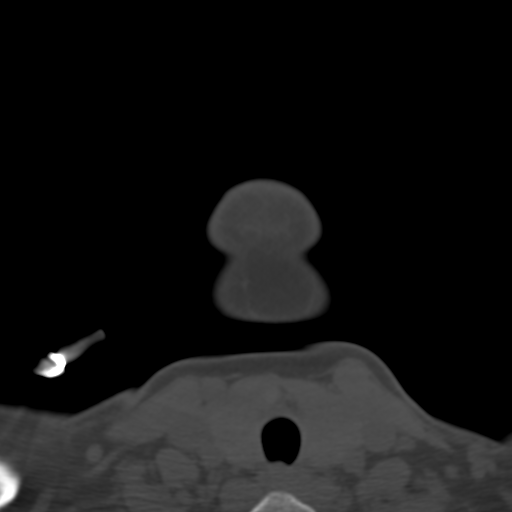
[im 13/71  bone]
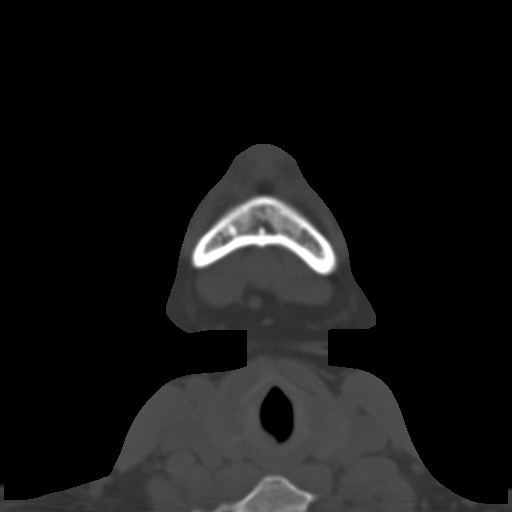
[im 20/71  bone]
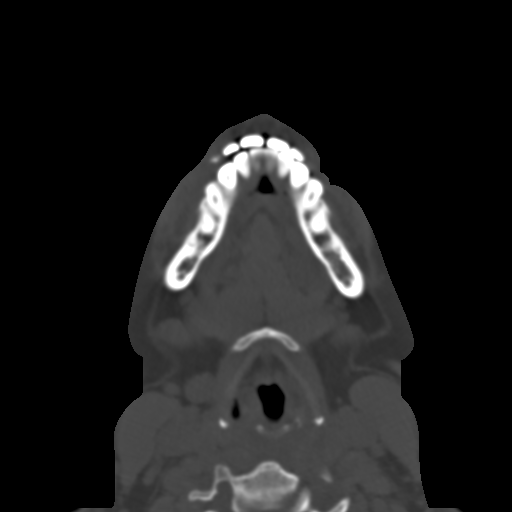
[im 27/71  bone]
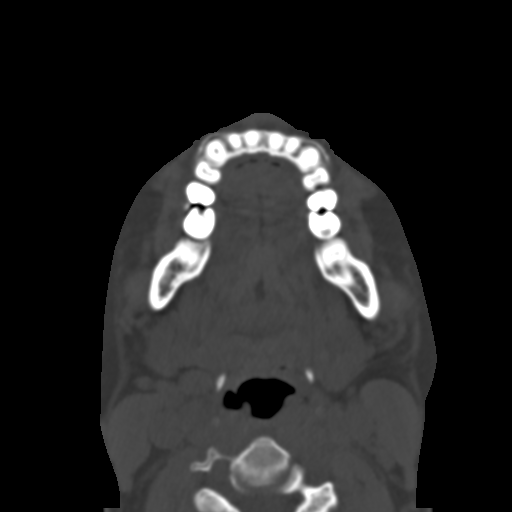
[im 37/71  brain]
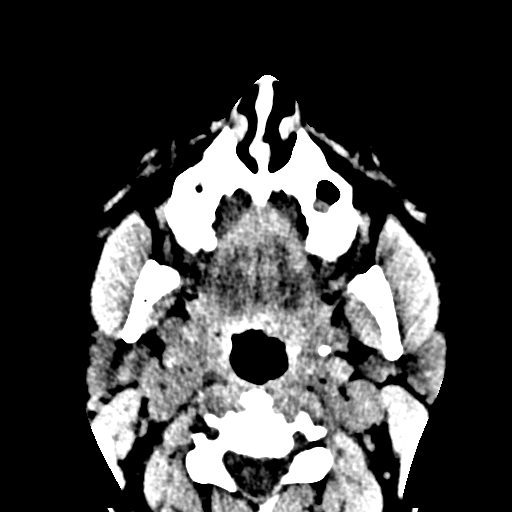
[im 37/71  bone]
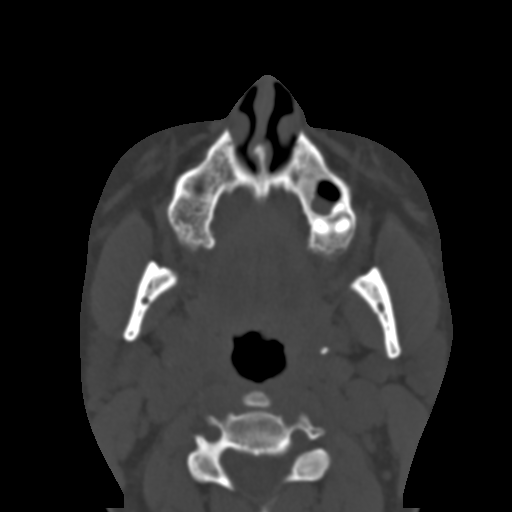
[im 44/71  bone]
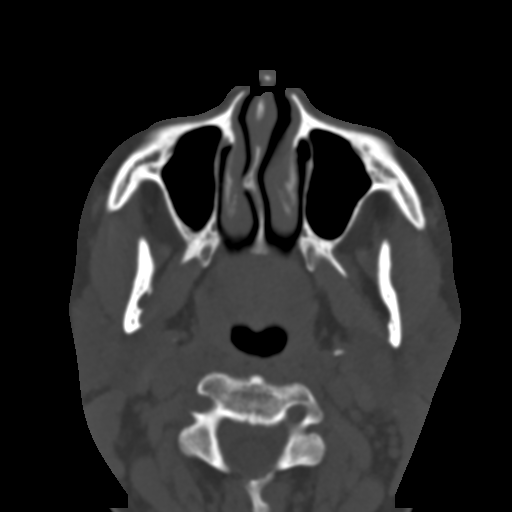
[im 51/71  bone]
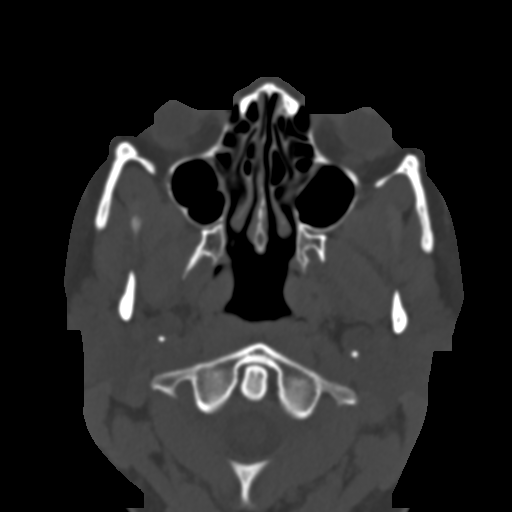
[im 58/71  bone]
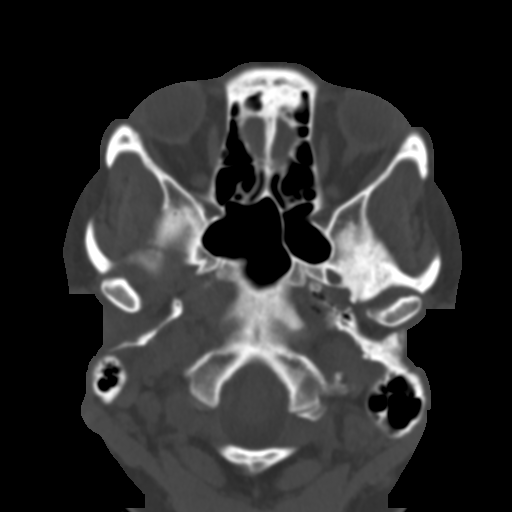
[im 66/71  brain]
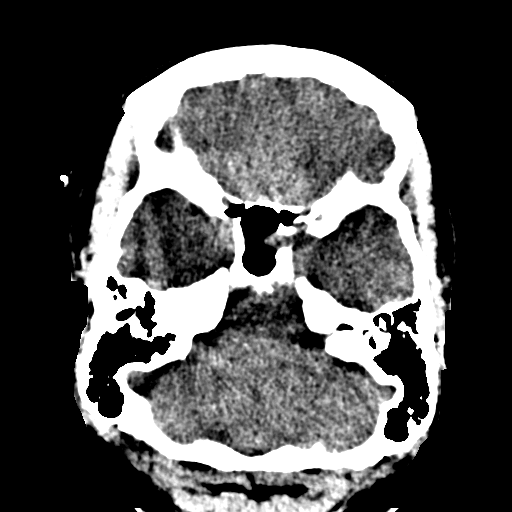
[im 66/71  bone]
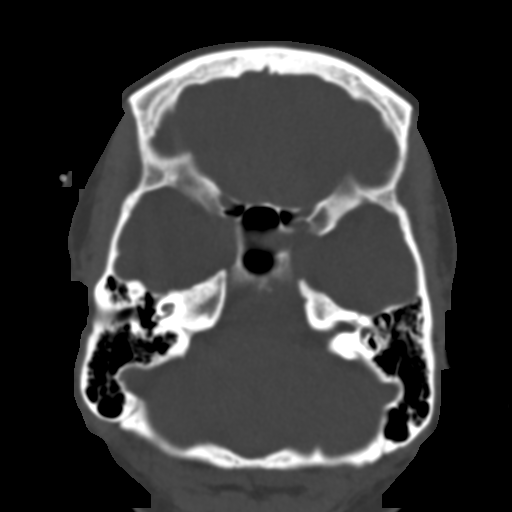

[Series 6: coronal soft · coronal · 0.36mm/px · 3 of 90 slices shown]
[im 30/90  bone]
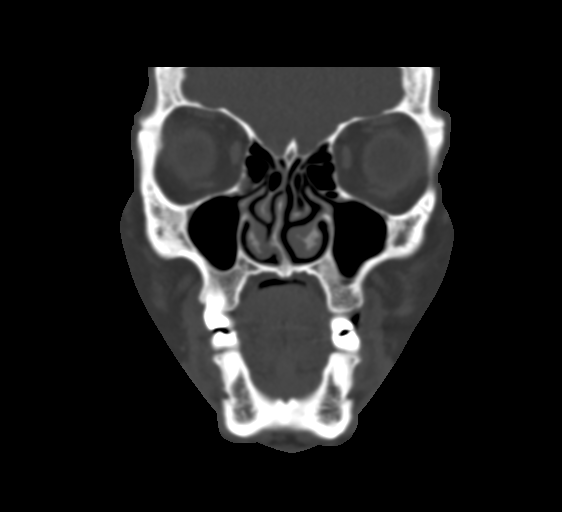
[im 40/90  bone]
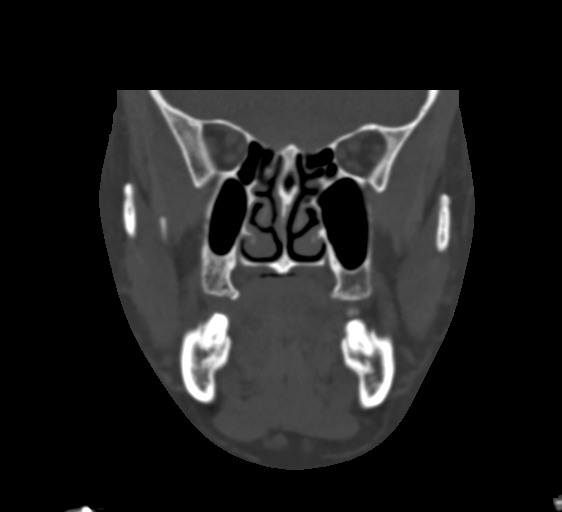
[im 50/90  bone]
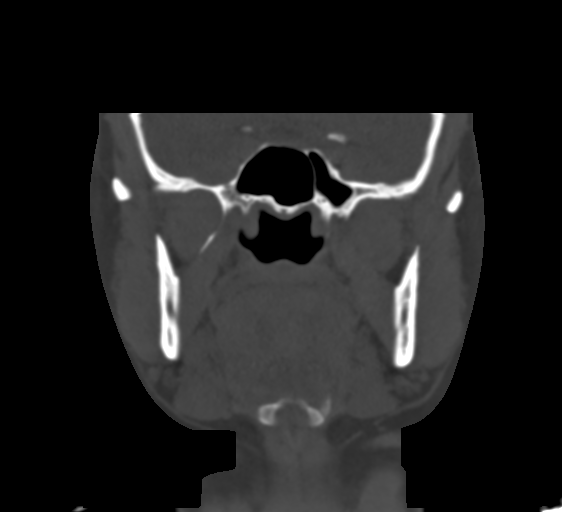

[Series 8: sagittal soft · sagittal · 0.35mm/px · 3 of 75 slices shown]
[im 25/75  bone]
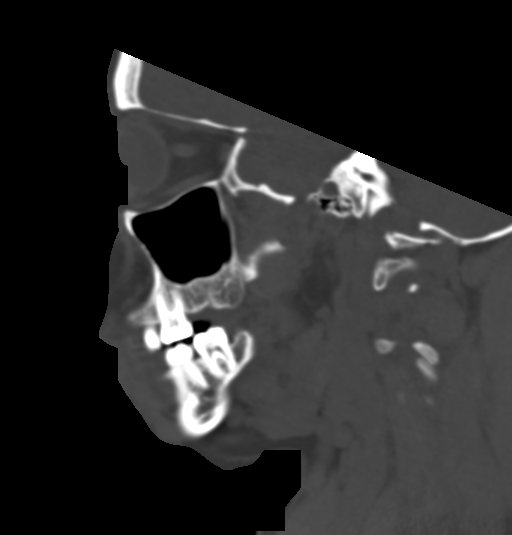
[im 38/75  bone]
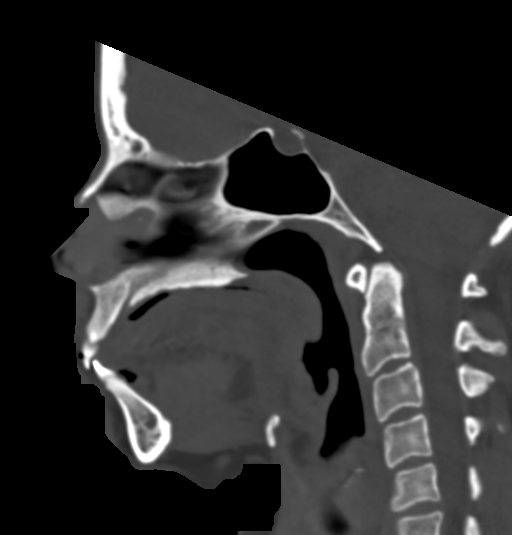
[im 50/75  bone]
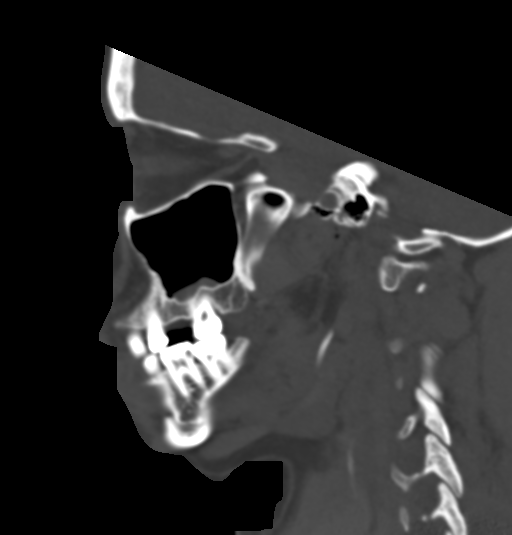

[15 of 47 positions shown; findings below may reference images not displayed]

FINDINGS: Osseous: No fracture or dislocation. No blastic or lytic bone
lesions.

Mandibular condyles bilaterally appear symmetric without appreciable
arthropathy. No erosion. There appears to be normal alignment of the
mandibular condyles with respective temporal eminences. Visualized
cervical spine unremarkable.

Orbits: Orbits appear symmetric bilaterally. No intraorbital
lesions.

Sinuses: There is mucosal thickening in the inferior left maxillary
antrum. Other paranasal sinuses are clear. Note that frontal sinuses
are essentially aplastic. No air-fluid level. No bony destruction or
expansion. Ostiomeatal unit complexes are patent bilaterally. There
is rightward deviation of the nasal septum. Concha bullosa noted on
the left, an anatomic variant.

Soft tissues: No soft tissue mass or hematoma. No evidence soft
tissue abscess. Salivary glands appear symmetric and unremarkable
bilaterally. No adenopathy. Tongue and tongue base regions appear
unremarkable. Visualized pharynx appears unremarkable. Note that the
styloid process on the left is longer than its counterpart on the
right.

Limited intracranial: Visualized intracranial structures appear
unremarkable.
IMPRESSION: 1. No fracture or dislocation. Temporomandibular joints appear
unremarkable by CT and symmetric. Note that the temporomandibular
joint menisci are not appreciable by CT. If there is concern for
temporomandibular joint meniscal subluxation, MR without contrast of
the temporomandibular joints would be the imaging study of choice to
further evaluate.

2. Left maxillary sinus disease. Frontal sinuses virtually aplastic.
Other paranasal sinuses clear.

3.  Rightward deviation nasal septum.

4. The left styloid process is longer than its right-sided
counterpart. The clinical significance of this finding is uncertain.

## 2022-11-17 ENCOUNTER — Emergency Department: Payer: Medicaid Other

## 2022-11-17 ENCOUNTER — Emergency Department
Admission: EM | Admit: 2022-11-17 | Discharge: 2022-11-17 | Disposition: A | Discharge status: Home / Self Care | Payer: Medicaid Other | Attending: Emergency Medicine | Admitting: Emergency Medicine

## 2022-11-17 ENCOUNTER — Other Ambulatory Visit: Payer: Self-pay

## 2022-11-17 DIAGNOSIS — R079 Chest pain, unspecified: Secondary | ICD-10-CM

## 2022-11-17 DIAGNOSIS — M79602 Pain in left arm: Secondary | ICD-10-CM | POA: Insufficient documentation

## 2022-11-17 DIAGNOSIS — R42 Dizziness and giddiness: Secondary | ICD-10-CM | POA: Insufficient documentation

## 2022-11-17 DIAGNOSIS — F419 Anxiety disorder, unspecified: Secondary | ICD-10-CM | POA: Diagnosis not present

## 2022-11-17 LAB — CBC
HCT: 40.9 % (ref 36.0–46.0)
Hemoglobin: 13.1 g/dL (ref 12.0–15.0)
MCH: 30 pg (ref 26.0–34.0)
MCHC: 32 g/dL (ref 30.0–36.0)
MCV: 93.6 fL (ref 80.0–100.0)
Platelets: 358 10*3/uL (ref 150–400)
RBC: 4.37 MIL/uL (ref 3.87–5.11)
RDW: 13.3 % (ref 11.5–15.5)
WBC: 10.3 10*3/uL (ref 4.0–10.5)
nRBC: 0 % (ref 0.0–0.2)

## 2022-11-17 LAB — BASIC METABOLIC PANEL
Anion gap: 7 (ref 5–15)
BUN: 17 mg/dL (ref 6–20)
CO2: 23 mmol/L (ref 22–32)
Calcium: 9.2 mg/dL (ref 8.9–10.3)
Chloride: 108 mmol/L (ref 98–111)
Creatinine, Ser: 0.74 mg/dL (ref 0.44–1.00)
GFR, Estimated: >60 mL/min (ref >60)
Glucose, Bld: 90 mg/dL (ref 70–99)
Potassium: 3.7 mmol/L (ref 3.5–5.1)
Sodium: 138 mmol/L (ref 135–145)

## 2022-11-17 LAB — TROPONIN I (HIGH SENSITIVITY): Troponin I (High Sensitivity): <2 ng/L (ref <18)

## 2022-11-17 LAB — POC URINE PREG, ED: Preg Test, Ur: NEGATIVE

## 2022-11-17 NOTE — ED Triage Notes (Signed)
Pt to ED for left sided chest pain radiating to left arm started yesterday. Reports dizziness started yesterday, states thought it was d/t cleaning with chemicals.

## 2022-11-17 NOTE — ED Provider Notes (Signed)
[facility] Provider Note    Event Date/Time   First MD Initiated Contact with Patient 11/17/22 0820     (approximate)   History   Chief Complaint Chest Pain   HPI  Abigail [name] is a 32 y.o. female with past medical history of endometriosis and anxiety who presents to the ED complaining of chest pain.  Patient reports that she has been dealing with constant discomfort in the left side of her chest since yesterday afternoon.  She describes it as a tightness that is not exacerbated or alleviated by anything in particular.  She denies any associated fevers or cough, does report feeling "short winded" at times.  She became more concerned today when she noticed pain going into her left arm as well as some dizziness, was convinced by her friend to come "get checked out."  She has not noticed any pain or swelling in her legs, denies any history of similar symptoms.  She does report that she has been under significant stress lately.     Physical Exam   Triage Vital Signs: ED Triage Vitals  Enc Vitals Group     BP 11/17/22 0816 121/82     Pulse Rate 11/17/22 0816 86     Resp 11/17/22 0816 18     Temp 11/17/22 0816 99 F (37.2 C)     Temp src --      SpO2 11/17/22 0816 100 %     Weight 11/17/22 0813 217 lb (98.4 kg)     Height 11/17/22 0813 5' 0.5" (1.537 m)     Head Circumference --      Peak Flow --      Pain Score 11/17/22 0813 8     Pain Loc --      Pain Edu? --      Excl. in GC? --     Most recent vital signs: Vitals:   11/17/22 0816  BP: 121/82  Pulse: 86  Resp: 18  Temp: 99 F (37.2 C)  SpO2: 100%    Constitutional: Alert and oriented. Eyes: Conjunctivae are normal. Head: Atraumatic. Nose: No congestion/rhinnorhea. Mouth/Throat: Mucous membranes are moist.  Cardiovascular: Normal rate, regular rhythm. Grossly normal heart sounds.  2+ radial pulses bilaterally. Respiratory: Normal respiratory effort.  No retractions. Lungs CTAB.  No  chest wall tenderness to palpation. Gastrointestinal: Soft and nontender. No distention. Musculoskeletal: No lower extremity tenderness nor edema.  Neurologic:  Normal speech and language. No gross focal neurologic deficits are appreciated.    ED Results / Procedures / Treatments   Labs (all labs ordered are listed, but only abnormal results are displayed) Labs Reviewed  BASIC METABOLIC PANEL  CBC  POC URINE PREG, ED  TROPONIN I (HIGH SENSITIVITY)     EKG  ED ECG REPORT I, Chesley Noon, the attending physician, personally viewed and interpreted this ECG.   Date: 11/17/2022  EKG Time: 8:15  Rate: 88  Rhythm: normal sinus rhythm  Axis: Normal  Intervals:none  ST&T Change: None  RADIOLOGY Chest x-ray reviewed and interpreted by me with no infiltrate, edema, or effusion.  PROCEDURES:  Critical Care performed: No  Procedures   MEDICATIONS ORDERED IN ED: Medications - No data to display   IMPRESSION / MDM / ASSESSMENT AND PLAN / ED COURSE  I reviewed the triage vital signs and the nursing notes.  32 y.o. female with past medical history of endometriosis and anxiety who presents to the ED complaining of constant chest tightness and mild difficulty breathing since yesterday along with lightheadedness and left arm pain.  Patient's presentation is most consistent with acute presentation with potential threat to life or bodily function.  Differential diagnosis includes, but is not limited to, ACS, arrhythmia, PE, dissection, pneumonia, pneumothorax, GERD, musculoskeletal pain, and anxiety.  Patient well-appearing and in no acute distress, vital signs are unremarkable.  EKG shows no evidence of arrhythmia or ischemia and symptoms seem atypical for ACS.  We will check troponin but I doubt PE as patient is PERC negative.  Labs and pregnancy testing are pending at this time.  Chest x-ray is unremarkable, labs are reassuring with no significant  anemia, leukocytosis, electrolyte abnormality, or AKI.  Troponin within normal limits and patient with minimal pain on reassessment.  Pregnancy testing is negative.  Patient does report significant anxiety and possible "panic attacks" at home, which is likely contributing to her symptoms.  She is appropriate for discharge home with PCP follow-up, was counseled to return to the ED for new or worsening symptoms.  Patient agrees with plan.      FINAL CLINICAL IMPRESSION(S) / ED DIAGNOSES   Final diagnoses:  Nonspecific chest pain  Anxiety     Rx / DC Orders   ED Discharge Orders     None        Note:  This document was prepared using Dragon voice recognition software and may include unintentional dictation errors.   Chesley Noon, MD 11/17/22 [phone]

## 2022-11-27 ENCOUNTER — Ambulatory Visit: Payer: Medicaid Other

## 2022-12-08 ENCOUNTER — Ambulatory Visit: Payer: Medicaid Other | Admitting: Family Medicine

## 2022-12-08 ENCOUNTER — Encounter: Payer: Self-pay | Admitting: Family Medicine

## 2022-12-08 DIAGNOSIS — B9689 Other specified bacterial agents as the cause of diseases classified elsewhere: Secondary | ICD-10-CM

## 2022-12-08 DIAGNOSIS — Z113 Encounter for screening for infections with a predominantly sexual mode of transmission: Secondary | ICD-10-CM | POA: Diagnosis not present

## 2022-12-08 DIAGNOSIS — F419 Anxiety disorder, unspecified: Secondary | ICD-10-CM

## 2022-12-08 LAB — HM HIV SCREENING LAB: HM HIV Screening: NEGATIVE

## 2022-12-08 MED ORDER — METRONIDAZOLE 500 MG PO TABS: 500.0000 mg | ORAL_TABLET | Freq: Two times a day (BID) | ORAL | 0 refills | Status: AC

## 2022-12-08 NOTE — Progress Notes (Addendum)
Pt is here for STD screening.  Wet mount results reviewed, positive amine and clue cells, Metronidazole 14 tablets lot #[id number] exp 10/19/23 provided to pt per standing order.  Pt also provided medication information sheet and voiced understanding. Condoms given.
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
## 2022-12-08 NOTE — Addendum Note (Signed)
Addended by: Lenice Llamas on: 12/08/2022 03:02 PM   Modules accepted: Orders

## 2022-12-09 LAB — WET PREP FOR TRICH, YEAST, CLUE
Trichomonas Exam: NEGATIVE
Yeast Exam: NEGATIVE

## 2022-12-16 ENCOUNTER — Encounter: Payer: Self-pay | Admitting: Emergency Medicine

## 2022-12-16 ENCOUNTER — Other Ambulatory Visit: Payer: Self-pay

## 2022-12-16 ENCOUNTER — Emergency Department
Admission: EM | Admit: 2022-12-16 | Discharge: 2022-12-16 | Disposition: A | Discharge status: Home / Self Care | Payer: Medicaid Other | Attending: Emergency Medicine | Admitting: Emergency Medicine

## 2022-12-16 DIAGNOSIS — R42 Dizziness and giddiness: Secondary | ICD-10-CM | POA: Diagnosis present

## 2022-12-16 DIAGNOSIS — H81399 Other peripheral vertigo, unspecified ear: Secondary | ICD-10-CM | POA: Insufficient documentation

## 2022-12-16 MED ORDER — ONDANSETRON 4 MG PO TBDP
4.0000 mg | ORAL_TABLET | Freq: Once | ORAL | Status: AC
  Administered 2022-12-16: 4 mg via ORAL
  Filled 2022-12-16: qty 1

## 2022-12-16 MED ORDER — MECLIZINE HCL 25 MG PO TABS
25.0000 mg | ORAL_TABLET | Freq: Once | ORAL | Status: AC
  Administered 2022-12-16: 25 mg via ORAL
  Filled 2022-12-16: qty 1

## 2022-12-16 MED ORDER — ONDANSETRON 4 MG PO TBDP: 4.0000 mg | ORAL_TABLET | Freq: Three times a day (TID) | ORAL | 0 refills | Status: DC | PRN

## 2022-12-16 MED ORDER — MECLIZINE HCL 25 MG PO TABS: 25.0000 mg | ORAL_TABLET | Freq: Three times a day (TID) | ORAL | 0 refills | Status: DC | PRN

## 2022-12-16 NOTE — ED Notes (Signed)
ED Provider at bedside. 

## 2022-12-16 NOTE — ED Triage Notes (Signed)
Pt to triage via w/c with no distress noted; reports that she awoke around 3am for work and noted dizziness accomp by nausea and frontal HA; denies hx of same

## 2022-12-16 NOTE — ED Provider Notes (Signed)
[facility] Provider Note    Event Date/Time   First MD Initiated Contact with Patient 12/16/22 [phone]     (approximate)   History   Dizziness   HPI  Abigail [name] is a 32 y.o. female with a history of endometriosis and anxiety, not on any medications, who presents with dizziness, acute onset when she awoke this morning.  The patient states that she got up during the night and started to feel it a little bit.  She describes the dizziness as losing her equilibrium and a sensation of spinning or movement.  She feels like she has to hold onto things in order to stay steady.  The dizziness is associated with nausea and vomiting.  The patient reports a mild frontal headache.  She denies any throbbing.  She has no changes in her vision.  She denies any changes in her hearing, ear pain, congestion, or ringing.  She has no prior history of similar dizziness.  She denies feeling like she is going to pass out and has no weakness.  I reviewed the past medical records.  The patient was most recently seen at the [facility] department for STI screening on 5/20.  She was seen in the ED on 4/29 for chest pain and had a negative workup at that time.   Physical Exam   Triage Vital Signs: ED Triage Vitals  Enc Vitals Group     BP 12/16/22 0609 110/72     Pulse Rate 12/16/22 0609 73     Resp 12/16/22 0609 18     Temp 12/16/22 0609 98 F (36.7 C)     Temp Source 12/16/22 0609 Oral     SpO2 12/16/22 0609 100 %     Weight 12/16/22 0602 217 lb (98.4 kg)     Height 12/16/22 0602 5\' 5"  (1.651 m)     Head Circumference --      Peak Flow --      Pain Score 12/16/22 0602 10     Pain Loc --      Pain Edu? --      Excl. in GC? --     Most recent vital signs: Vitals:   12/16/22 0609 12/16/22 0700  BP: 110/72   Pulse: 73 64  Resp: 18 17  Temp: 98 F (36.7 C)   SpO2: 100%      General: Alert, well-appearing, no distress.  CV:  Good peripheral perfusion.   Resp:  Normal effort.  Abd:  No distention.  Other:  EOMI.  PERRLA.  No photophobia.  No facial droop.  Cranial nerves III through XII grossly intact.  Motor intact in all extremities.  No ataxia on finger-to-nose or heel-to-shin.  No pronator drift.  No nystagmus.  Bilateral ear canals and TMs appear normal.   ED Results / Procedures / Treatments   Labs (all labs ordered are listed, but only abnormal results are displayed) Labs Reviewed - No data to display   EKG  ED ECG REPORT I, Dionne Bucy, the attending physician, personally viewed and interpreted this ECG.  Date: 12/16/2022 EKG Time: 0609 Rate: 72 Rhythm: normal sinus rhythm QRS Axis: normal Intervals: normal ST/T Wave abnormalities: normal Narrative Interpretation: no evidence of acute ischemia    RADIOLOGY    PROCEDURES:  Critical Care performed: No  Procedures   MEDICATIONS ORDERED IN ED: Medications  ondansetron (ZOFRAN-ODT) disintegrating tablet 4 mg (4 mg Oral Given 12/16/22 0626)  meclizine (ANTIVERT) tablet 25 mg (  25 mg Oral Given 12/16/22 0626)     IMPRESSION / MDM / ASSESSMENT AND PLAN / ED COURSE  I reviewed the triage vital signs and the nursing notes.  33 year old female with PMH as noted above presents with acute onset of dizziness which is described as feeling unsteady and like she needs to hold onto things, associated with nausea and vomiting.  Vital signs are normal.  Physical exam, including thorough neurologic exam, is normal.  Differential diagnosis includes, but is not limited to, peripheral vertigo, possibly either labyrinthitis or BPPV.  There is no evidence of central vertigo or other acute CNS etiology.  Neurologic exam is normal.  There is no evidence of systemic cause such as electrolyte abnormality or cardiac arrhythmia.  There is no indication for imaging or lab workup at this time.  We will treat symptomatically with Zofran and meclizine and reassess.  Patient's  presentation is most consistent with acute, uncomplicated illness.  ----------------------------------------- 7:14 AM on 12/16/2022 -----------------------------------------  The vertigo is improved.  The patient is stable for discharge home.  Return precautions given, and she expresses understanding.   FINAL CLINICAL IMPRESSION(S) / ED DIAGNOSES   Final diagnoses:  Peripheral vertigo, unspecified laterality     Rx / DC Orders   ED Discharge Orders          Ordered    meclizine (ANTIVERT) 25 MG tablet  3 times daily PRN        12/16/22 0628    ondansetron (ZOFRAN-ODT) 4 MG disintegrating tablet  Every 8 hours PRN        12/16/22 1610             Note:  This document was prepared using Dragon voice recognition software and may include unintentional dictation errors.    Dionne Bucy, MD 12/16/22 [phone]

## 2022-12-19 ENCOUNTER — Other Ambulatory Visit: Payer: Self-pay

## 2022-12-19 ENCOUNTER — Encounter: Payer: Self-pay | Admitting: Emergency Medicine

## 2022-12-19 ENCOUNTER — Emergency Department
Admission: EM | Admit: 2022-12-19 | Discharge: 2022-12-19 | Disposition: A | Discharge status: Home / Self Care | Payer: Medicaid Other | Attending: Emergency Medicine | Admitting: Emergency Medicine

## 2022-12-19 ENCOUNTER — Emergency Department: Payer: Medicaid Other

## 2022-12-19 DIAGNOSIS — R42 Dizziness and giddiness: Secondary | ICD-10-CM | POA: Diagnosis present

## 2022-12-19 LAB — BASIC METABOLIC PANEL
Anion gap: 5 (ref 5–15)
BUN: 11 mg/dL (ref 6–20)
CO2: 25 mmol/L (ref 22–32)
Calcium: 9 mg/dL (ref 8.9–10.3)
Chloride: 106 mmol/L (ref 98–111)
Creatinine, Ser: 0.93 mg/dL (ref 0.44–1.00)
GFR, Estimated: >60 mL/min (ref >60)
Glucose, Bld: 137 mg/dL — ABNORMAL HIGH (ref 70–99)
Potassium: 4.2 mmol/L (ref 3.5–5.1)
Sodium: 136 mmol/L (ref 135–145)

## 2022-12-19 LAB — CBC
HCT: 45.5 % (ref 36.0–46.0)
Hemoglobin: 14.2 g/dL (ref 12.0–15.0)
MCH: 29.6 pg (ref 26.0–34.0)
MCHC: 31.2 g/dL (ref 30.0–36.0)
MCV: 94.8 fL (ref 80.0–100.0)
Platelets: 383 10*3/uL (ref 150–400)
RBC: 4.8 MIL/uL (ref 3.87–5.11)
RDW: 13.3 % (ref 11.5–15.5)
WBC: 6.6 10*3/uL (ref 4.0–10.5)
nRBC: 0 % (ref 0.0–0.2)

## 2022-12-19 MED ORDER — DIAZEPAM 2 MG PO TABS
2.0000 mg | ORAL_TABLET | Freq: Once | ORAL | Status: AC
  Administered 2022-12-19: 2 mg via ORAL
  Filled 2022-12-19: qty 1

## 2022-12-19 MED ORDER — DIAZEPAM 2 MG PO TABS: 2.0000 mg | ORAL_TABLET | Freq: Three times a day (TID) | ORAL | 0 refills | Status: AC | PRN

## 2022-12-19 NOTE — ED Notes (Signed)
Patient c/o nausea with movement. A &O x4. Patient c/o dizziness and blurry vision. Room is darkened for comfort. PERRL

## 2022-12-19 NOTE — ED Provider Notes (Signed)
[facility] Provider Note    Event Date/Time   First MD Initiated Contact with Patient 12/19/22 1014     (approximate)   History   Dizziness   HPI  Abigail [name] is a 32 y.o. female with no significant past medical history who presents with complaints of dizziness.  Patient was seen here 3 days ago and diagnosed with peripheral vertigo.  She has been taking meclizine and Zofran with little improvement.  Now she reports that she has a white noise sound in her right ear as well as a fullness feeling.  No weakness or numbness or headaches.  ENT cannot see her until next week     Physical Exam   Triage Vital Signs: ED Triage Vitals [12/19/22 0934]  Enc Vitals Group     BP 115/76     Pulse Rate 75     Resp 16     Temp 98.2 F (36.8 C)     Temp Source Oral     SpO2 100 %     Weight 98.4 kg (217 lb)     Height 1.651 m (5\' 5" )     Head Circumference      Peak Flow      Pain Score 8     Pain Loc      Pain Edu?      Excl. in GC?     Most recent vital signs: Vitals:   12/19/22 0934 12/19/22 1220  BP: 115/76 118/78  Pulse: 75 74  Resp: 16 16  Temp: 98.2 F (36.8 C)   SpO2: 100% 100%     General: Awake, no distress.  CV:  Good peripheral perfusion.  Resp:  Normal effort.  Abd:  No distention.  Other:  TMs appear normal bilaterally.  Neuroexam demonstrates cranial nerves II through XII are intact, normal strength in all extremities.  Patient has vertigo only when she moves her head   ED Results / Procedures / Treatments   Labs (all labs ordered are listed, but only abnormal results are displayed) Labs Reviewed  BASIC METABOLIC PANEL - Abnormal; Notable for the following components:      Result Value   Glucose, Bld 137 (*)    All other components within normal limits  CBC  URINALYSIS, ROUTINE W REFLEX MICROSCOPIC  CBG MONITORING, ED  POC URINE PREG, ED     EKG  ED ECG REPORT I, Jene Every, the attending physician,  personally viewed and interpreted this ECG.  Date: 12/19/2022  Rhythm: normal sinus rhythm QRS Axis: normal Intervals: normal ST/T Wave abnormalities: normal Narrative Interpretation: no evidence of acute ischemia    RADIOLOGY CT head viewed interpret by me, no acute abnormality    PROCEDURES:  Critical Care performed:   Procedures   MEDICATIONS ORDERED IN ED: Medications  diazepam (VALIUM) tablet 2 mg (2 mg Oral Given 12/19/22 1050)     IMPRESSION / MDM / ASSESSMENT AND PLAN / ED COURSE  I reviewed the triage vital signs and the nursing notes. Patient's presentation is most consistent with acute complicated illness / injury requiring diagnostic workup.  Patient here with continued symptoms of vertigo, exam still appears consistent with peripheral vertigo although Mnire's disease is a possibility given white noise sensation from the right ear.  Lab work checked which is unremarkable, CT head is unremarkable.  Patient treated with p.o. Valium, she is not driving today.  She will follow-up with ENT, no indication for admission at this time, return  precautions discussed.      FINAL CLINICAL IMPRESSION(S) / ED DIAGNOSES   Final diagnoses:  Vertigo     Rx / DC Orders   ED Discharge Orders          Ordered    diazepam (VALIUM) 2 MG tablet  Every 8 hours PRN        12/19/22 1221             Note:  This document was prepared using Dragon voice recognition software and may include unintentional dictation errors.   Jene Every, MD 12/19/22 [phone]

## 2022-12-19 NOTE — ED Triage Notes (Signed)
Pt to ED via POV, pt states that she was seen in the ED on 5/28 and diagnosed with vertigo. Pt states that her symptoms have not improved. Pt is taking prescribed medications without relief. Pt states that she called to get a follow-up appt with ENT but they are unable to see her until next week. Pt reports almost falling several times due to the dizziness. Pt is currently in NAD.

## 2022-12-22 ENCOUNTER — Other Ambulatory Visit: Payer: Self-pay | Admitting: Otolaryngology

## 2022-12-22 DIAGNOSIS — E041 Nontoxic single thyroid nodule: Secondary | ICD-10-CM

## 2022-12-26 ENCOUNTER — Ambulatory Visit
Admission: RE | Admit: 2022-12-26 | Discharge: 2022-12-26 | Disposition: A | Discharge status: Home / Self Care | Payer: Medicaid Other | Source: Ambulatory Visit | Attending: Otolaryngology | Admitting: Otolaryngology

## 2022-12-26 DIAGNOSIS — E041 Nontoxic single thyroid nodule: Secondary | ICD-10-CM

## 2022-12-31 ENCOUNTER — Ambulatory Visit: Payer: Medicaid Other | Admitting: Licensed Clinical Social Worker
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
## 2023-02-26 ENCOUNTER — Encounter: Payer: Self-pay | Admitting: Emergency Medicine

## 2023-02-26 ENCOUNTER — Other Ambulatory Visit: Payer: Self-pay

## 2023-02-26 ENCOUNTER — Emergency Department
Admission: EM | Admit: 2023-02-26 | Discharge: 2023-02-26 | Disposition: A | Discharge status: Home / Self Care | Payer: Medicaid Other | Attending: Student in an Organized Health Care Education/Training Program | Admitting: Student in an Organized Health Care Education/Training Program

## 2023-02-26 DIAGNOSIS — R519 Headache, unspecified: Secondary | ICD-10-CM | POA: Diagnosis present

## 2023-02-26 LAB — POC URINE PREG, ED: Preg Test, Ur: NEGATIVE

## 2023-02-26 MED ORDER — BUTALBITAL-APAP-CAFFEINE 50-325-40 MG PO TABS: 1.0000 | ORAL_TABLET | Freq: Four times a day (QID) | ORAL | 0 refills | Status: AC | PRN

## 2023-02-26 MED ORDER — KETOROLAC TROMETHAMINE 30 MG/ML IJ SOLN
30.0000 mg | Freq: Once | INTRAMUSCULAR | Status: AC
  Administered 2023-02-26: 30 mg via INTRAMUSCULAR
  Filled 2023-02-26: qty 1

## 2023-02-26 MED ORDER — METOCLOPRAMIDE HCL 10 MG PO TABS: 10.0000 mg | ORAL_TABLET | Freq: Four times a day (QID) | ORAL | 0 refills | Status: DC | PRN

## 2023-02-26 MED ORDER — METOCLOPRAMIDE HCL 10 MG PO TABS
10.0000 mg | ORAL_TABLET | Freq: Once | ORAL | Status: AC
  Administered 2023-02-26: 10 mg via ORAL
  Filled 2023-02-26: qty 1

## 2023-02-26 NOTE — ED Provider Notes (Signed)
[facility] Provider Note    Event Date/Time   First MD Initiated Contact with Patient 02/26/23 1208     (approximate)   History   Headache   HPI  Abigail [name] is a 32 y.o. female with a history of close to migraine headaches presents to the ER for evaluation of headache for the past several days.  Just recently traveled and was down in [place].  Had some congestion and cough which has resolved.  Does feel consistent with previous migraine headaches has taken over-the-counter medications without any improvement.  No neck stiffness no measured fever.  No numbness or tingling.  This is not the worst headache of her life.     Physical Exam   Triage Vital Signs: ED Triage Vitals  Encounter Vitals Group     BP 02/26/23 1205 120/81     Systolic BP Percentile --      Diastolic BP Percentile --      Pulse Rate 02/26/23 1205 82     Resp 02/26/23 1205 18     Temp 02/26/23 1205 98.8 F (37.1 C)     Temp Source 02/26/23 1205 Oral     SpO2 02/26/23 1205 100 %     Weight 02/26/23 1206 217 lb (98.4 kg)     Height 02/26/23 1206 5\' 5"  (1.651 m)     Head Circumference --      Peak Flow --      Pain Score 02/26/23 1206 10     Pain Loc --      Pain Education --      Exclude from Growth Chart --     Most recent vital signs: Vitals:   02/26/23 1205  BP: 120/81  Pulse: 82  Resp: 18  Temp: 98.8 F (37.1 C)  SpO2: 100%     Constitutional: Alert  Eyes: Conjunctivae are normal.  Head: Atraumatic. Nose: No congestion/rhinnorhea. Mouth/Throat: Mucous membranes are moist.   Neck: Painless ROM.  Cardiovascular:   Good peripheral circulation. Respiratory: Normal respiratory effort.  No retractions.  Gastrointestinal: Soft and nontender.  Musculoskeletal:  no deformity Neurologic:  MAE spontaneously. No gross focal neurologic deficits are appreciated.  Skin:  Skin is warm, dry and intact. No rash noted. Psychiatric: Mood and affect are normal. Speech  and behavior are normal.    ED Results / Procedures / Treatments   Labs (all labs ordered are listed, but only abnormal results are displayed) Labs Reviewed  POC URINE PREG, ED     EKG     RADIOLOGY    PROCEDURES:  Critical Care performed:   Procedures   MEDICATIONS ORDERED IN ED: Medications  metoCLOPramide (REGLAN) tablet 10 mg (has no administration in time range)  ketorolac (TORADOL) 30 MG/ML injection 30 mg (has no administration in time range)     IMPRESSION / MDM / ASSESSMENT AND PLAN / ED COURSE  I reviewed the triage vital signs and the nursing notes.                              Differential diagnosis includes, but is not limited to, tension, cluster, migraine, COVID  Patient presented to the ER for evaluation of headache consistent with her previous migraines.  She is afebrile hemodynamically stable clinically very well-appearing.  Do not feel that diagnostic imaging clinically indicated that she has a history of the same and has had imaging for this in the past.  This is not consistent with meningitis or encephalitis.  Not consistent with SAH or IPH.  Patient requesting p.o. or IM medication and to be discharged.  Has had improvement with Fioricet in the past.       FINAL CLINICAL IMPRESSION(S) / ED DIAGNOSES   Final diagnoses:  Acute nonintractable headache, unspecified headache type     Rx / DC Orders   ED Discharge Orders          Ordered    butalbital-acetaminophen-caffeine (FIORICET) 50-325-40 MG tablet  Every 6 hours PRN        02/26/23 1223    metoCLOPramide (REGLAN) 10 MG tablet  Every 6 hours PRN        02/26/23 1223             Note:  This document was prepared using Dragon voice recognition software and may include unintentional dictation errors.    Willy Eddy, MD 02/26/23 [phone]

## 2023-02-26 NOTE — Discharge Instructions (Signed)

## 2023-02-26 NOTE — ED Triage Notes (Signed)
PT sts that she has had a headache for the last week.

## 2023-06-09 ENCOUNTER — Other Ambulatory Visit: Payer: Self-pay

## 2023-06-09 ENCOUNTER — Emergency Department
Admission: EM | Admit: 2023-06-09 | Discharge: 2023-06-09 | Disposition: A | Discharge status: Home / Self Care | Payer: Medicaid Other | Attending: Emergency Medicine | Admitting: Emergency Medicine

## 2023-06-09 ENCOUNTER — Emergency Department: Payer: Medicaid Other

## 2023-06-09 DIAGNOSIS — R002 Palpitations: Secondary | ICD-10-CM | POA: Insufficient documentation

## 2023-06-09 LAB — CBC
HCT: 39.2 % (ref 36.0–46.0)
Hemoglobin: 12.9 g/dL (ref 12.0–15.0)
MCH: 30.7 pg (ref 26.0–34.0)
MCHC: 32.9 g/dL (ref 30.0–36.0)
MCV: 93.3 fL (ref 80.0–100.0)
Platelets: 411 10*3/uL — ABNORMAL HIGH (ref 150–400)
RBC: 4.2 MIL/uL (ref 3.87–5.11)
RDW: 13.3 % (ref 11.5–15.5)
WBC: 8.8 10*3/uL (ref 4.0–10.5)
nRBC: 0 % (ref 0.0–0.2)

## 2023-06-09 LAB — TROPONIN I (HIGH SENSITIVITY): Troponin I (High Sensitivity): <2 ng/L (ref <18)

## 2023-06-09 LAB — BASIC METABOLIC PANEL
Anion gap: 8 (ref 5–15)
BUN: 13 mg/dL (ref 6–20)
CO2: 24 mmol/L (ref 22–32)
Calcium: 8.7 mg/dL — ABNORMAL LOW (ref 8.9–10.3)
Chloride: 105 mmol/L (ref 98–111)
Creatinine, Ser: 0.73 mg/dL (ref 0.44–1.00)
GFR, Estimated: >60 mL/min (ref >60)
Glucose, Bld: 122 mg/dL — ABNORMAL HIGH (ref 70–99)
Potassium: 4 mmol/L (ref 3.5–5.1)
Sodium: 137 mmol/L (ref 135–145)

## 2023-06-09 LAB — POC URINE PREG, ED: Preg Test, Ur: NEGATIVE

## 2023-06-09 NOTE — ED Triage Notes (Signed)
C/O palpitations, chest pain, sob, dizziness intermittently x 2-3 days.  AAOx3.  Skin warm and dry. NAD

## 2023-06-09 NOTE — Discharge Instructions (Signed)
Please follow-up with your primary care provider and cardiology.  Cardiology office should give you a call for reassessment.  Please return for any worsening shortness of breath symptoms.

## 2023-06-09 NOTE — ED Provider Notes (Signed)
[facility] Provider Note    Event Date/Time   First MD Initiated Contact with Patient 06/09/23 1034     (approximate)   History   Palpitations   HPI Abigail [name] is a 32 y.o. female presenting today for palpitations.  Patient states they have been coming and going over the past 2 to 3 days.  She occasionally has some chest tightness but currently denying any chest pain.  Denies any overt shortness of breath.  No fevers, nausea, abdominal pain, vomiting, dysuria, leg pain, leg swelling.  States she has had the symptoms intermittently over the past several months.  She thinks they may be related to stress and anxiety which has happened in the past.  Has not taken any medication for them.  Reviewed most recent chart notes.     Physical Exam   Triage Vital Signs: ED Triage Vitals  Encounter Vitals Group     BP 06/09/23 0957 124/85     Systolic BP Percentile --      Diastolic BP Percentile --      Pulse Rate 06/09/23 0957 85     Resp 06/09/23 0957 16     Temp 06/09/23 0957 98.2 F (36.8 C)     Temp Source 06/09/23 0957 Oral     SpO2 06/09/23 0957 100 %     Weight 06/09/23 0956 230 lb (104.3 kg)     Height 06/09/23 0956 5\' 5"  (1.651 m)     Head Circumference --      Peak Flow --      Pain Score 06/09/23 0955 9     Pain Loc --      Pain Education --      Exclude from Growth Chart --     Most recent vital signs: Vitals:   06/09/23 0957  BP: 124/85  Pulse: 85  Resp: 16  Temp: 98.2 F (36.8 C)  SpO2: 100%   Physical Exam: I have reviewed the vital signs and nursing notes. General: Awake, alert, no acute distress.  Nontoxic appearing. Head:  Atraumatic, normocephalic.   ENT:  EOM intact, PERRL. Oral mucosa is pink and moist with no lesions. Neck: Neck is supple with full range of motion, No meningeal signs. Cardiovascular:  RRR, No murmurs. Peripheral pulses palpable and equal bilaterally. Respiratory:  Symmetrical chest wall expansion.   No rhonchi, rales, or wheezes.  Good air movement throughout.  No use of accessory muscles.   Musculoskeletal:  No cyanosis or edema. Moving extremities with full ROM Abdomen:  Soft, nontender, nondistended. Neuro:  GCS 15, moving all four extremities, interacting appropriately. Speech clear. Psych:  Calm, appropriate.   Skin:  Warm, dry, no rash.    ED Results / Procedures / Treatments   Labs (all labs ordered are listed, but only abnormal results are displayed) Labs Reviewed  BASIC METABOLIC PANEL - Abnormal; Notable for the following components:      Result Value   Glucose, Bld 122 (*)    Calcium 8.7 (*)    All other components within normal limits  CBC - Abnormal; Notable for the following components:   Platelets 411 (*)    All other components within normal limits  POC URINE PREG, ED  TROPONIN I (HIGH SENSITIVITY)     EKG My EKG interpretation: Rate of 85, normal sinus rhythm, normal axis, normal intervals.  No acute ST elevations or depressions.  T wave inversion present in lead III which was also seen on most  recent EKG on 12/19/2022   RADIOLOGY Independently interpreted chest x-ray with no acute pathology   PROCEDURES:  Critical Care performed: No  Procedures   MEDICATIONS ORDERED IN ED: Medications - No data to display   IMPRESSION / MDM / ASSESSMENT AND PLAN / ED COURSE  I reviewed the triage vital signs and the nursing notes.                              Differential diagnosis includes, but is not limited to, palpitations, anxiety, low suspicion ACS, low suspicion pneumonia  Patient's presentation is most consistent with acute complicated illness / injury requiring diagnostic workup.  Patient is a 32 year old female presenting today for intermittent palpitations x 3 days but also known history of this occurring over several months to years.  Exam here is reassuring with stable vital signs.  EKG with no ischemia and consistent with most recent EKG.   Troponin negative and with duration of symptoms do not think she needs repeat.  No concern for ACS.  Patient PERC negative.  CBC and BMP unremarkable.  Independently interpreted chest x-ray with no obvious acute cardiopulmonary abnormalities.  Given significant delay from radiology reads, do not think patient needs to stay for complete read as she is asymptomatic at this time.  Do believe the patient needs follow-up with cardiology given chronicity of palpitation symptoms to discuss whether some like a Holter monitor may be beneficial.  Also instructed her on anxiety and stress and she believes this is most likely the cause of her symptoms.  Patient was given strict return precautions.  The patient is on the cardiac monitor to evaluate for evidence of arrhythmia and/or significant heart rate changes.     FINAL CLINICAL IMPRESSION(S) / ED DIAGNOSES   Final diagnoses:  Palpitations     Rx / DC Orders   ED Discharge Orders          Ordered    Ambulatory referral to Cardiology       Comments: If you have not heard from the Cardiology office within the next 72 hours please call [phone].   06/09/23 1116             Note:  This document was prepared using Dragon voice recognition software and may include unintentional dictation errors.   Janith Lima, MD 06/09/23 1116

## 2024-01-20 ENCOUNTER — Ambulatory Visit

## 2024-01-26 ENCOUNTER — Ambulatory Visit: Admitting: Family Medicine

## 2024-01-26 DIAGNOSIS — A599 Trichomoniasis, unspecified: Secondary | ICD-10-CM

## 2024-01-26 DIAGNOSIS — Z113 Encounter for screening for infections with a predominantly sexual mode of transmission: Secondary | ICD-10-CM

## 2024-01-26 LAB — WET PREP FOR TRICH, YEAST, CLUE
Clue Cell Exam: NEGATIVE
Trichomonas Exam: POSITIVE — AB
Yeast Exam: NEGATIVE

## 2024-01-26 LAB — HM HIV SCREENING LAB: HM HIV Screening: NEGATIVE

## 2024-01-26 MED ORDER — METRONIDAZOLE 500 MG PO TABS: 500.0000 mg | ORAL_TABLET | Freq: Two times a day (BID) | ORAL | Status: AC
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
## 2024-01-26 NOTE — Progress Notes (Signed)
 Pt is here for STD screening. Wet prep results reviewed with patient and was dispensed Metronidazole  500 mg 2x/day for 7 days today. I provided counseling today regarding the medication, the side effects and when to call clinic. Patient given the opportunity to ask questions for any clarification. Condoms, brochure and contact card given. Wilkie Drought, RN.

## 2024-02-05 ENCOUNTER — Encounter: Payer: Self-pay | Admitting: Family Medicine

## 2024-03-08 ENCOUNTER — Encounter: Payer: Self-pay | Admitting: Family Medicine

## 2024-03-08 ENCOUNTER — Ambulatory Visit: Admitting: Family Medicine

## 2024-03-08 DIAGNOSIS — Z113 Encounter for screening for infections with a predominantly sexual mode of transmission: Secondary | ICD-10-CM | POA: Diagnosis not present

## 2024-03-08 LAB — WET PREP FOR TRICH, YEAST, CLUE
Clue Cell Exam: POSITIVE — AB
Trichomonas Exam: NEGATIVE
Yeast Exam: NEGATIVE

## 2024-03-08 NOTE — Progress Notes (Signed)
 In house wet prep results reviewed by provider during visit. Patient aware that prescription will be called in to pharmacy. Consuelo Kingsley RN
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
## 2024-03-08 NOTE — Patient Instructions (Signed)
 STI screening - Today we obtained a vaginal swab to screen for gonorrhea, chlamydia, and trichomonas - If the results are abnormal, I will give you a call.    Estimated time frame for results collected at the [facility] Department: Same day Trichomonas Yeast BV (bacterial vaginosis)  Within 1-2 weeks Gonorrhea Chlamydia  Within 1-2 weeks HIV Syphilis Hepatitis B Hepatitis C

## 2024-03-15 ENCOUNTER — Ambulatory Visit: Payer: Self-pay

## 2024-03-25 ENCOUNTER — Telehealth: Payer: Self-pay | Admitting: Family Medicine

## 2024-03-25 DIAGNOSIS — B9689 Other specified bacterial agents as the cause of diseases classified elsewhere: Secondary | ICD-10-CM

## 2024-03-25 MED ORDER — METRONIDAZOLE 500 MG PO TABS: 500.0000 mg | ORAL_TABLET | Freq: Two times a day (BID) | ORAL | 0 refills | Status: AC

## 2024-03-25 NOTE — Telephone Encounter (Signed)
 Appears wet prep showed BV but I cannot find note regarding metronidazole  being sent in.   Orders signed in this encounter.   Dorothyann Helling, MD 03/25/24  6:21 PM

## 2024-03-25 NOTE — Telephone Encounter (Signed)
 Pt. Said she came in on August 19th and was going to send a prescription to her pharmacy and never did.

## 2024-03-28 ENCOUNTER — Telehealth: Payer: Self-pay | Admitting: Family Medicine

## 2024-07-19 ENCOUNTER — Ambulatory Visit

## 2024-07-19 DIAGNOSIS — Z113 Encounter for screening for infections with a predominantly sexual mode of transmission: Secondary | ICD-10-CM

## 2024-07-19 LAB — HM HIV SCREENING LAB: HM HIV Screening: NEGATIVE

## 2024-07-19 LAB — WET PREP FOR TRICH, YEAST, CLUE
Clue Cell Exam: NEGATIVE
Trichomonas Exam: NEGATIVE
Yeast Exam: NEGATIVE

## 2024-07-19 NOTE — Progress Notes (Signed)
 Pt here for STI screening.  Wet mount results reviewed with patient.  No treatment needed at this time.  Condoms declined.-Collins Scotland, RN

## 2024-07-19 NOTE — Progress Notes (Signed)
 " [facility] Department STI clinic [address] [place] [address] Main phone: [phone]  STI screening visit  Subjective:  Abigail [name] is a 33 y.o. female being seen today for an STI screening visit. The patient reports they do not have symptoms.    Patient has the following medical conditions:  Patient Active Problem List   Diagnosis Date Noted   H/O abuse 04/16/2022   Anxiety/depression 04/16/2022   Morbid obesity (HCC) 11/14/2020   Marijuana use daily 11/14/2020   H/O tubal ligation 2016 11/14/2020   Smoker 11/14/2020   Vapes nicotine containing substance 11/14/2020   Lumbago 06/18/2015   Chief Complaint  Patient presents with   SEXUALLY TRANSMITTED DISEASE    HPI Patient reports desire for STI testing, no symptoms. No contacts. Believes her partner has had other sexual partners.   Reproductive considerations Patient reports they are not pregnant  and not breastfeeding. They do not desire a pregnancy in the next year. Patient is currently using no method - no contraceptive precautions to prevent pregnancy. They reported they are not interested in discussing contraception today.    Patient's last menstrual period was 07/12/2024 (exact date).  Does the patient using douching products? No  Patient's routine cervical screening is up to date and next due 2026.  See flowsheet for further details and programmatic requirements Hyperlink available at the top of the signed note in blue.  Flow sheet content below:  Pregnancy Intention Screening Does the patient want to become pregnant in the next year?: Yes Does the patient's partner want to become pregnant in the next year?: N/A Would the patient like to discuss contraceptive options today?: No Reason For STD Screen STD Screening: Is asymptomatic Have you ever had an STD?: Yes History of Antibiotic use in the past 2 weeks?: No STD Symptoms Denies all: Yes Risk Factors for Hep  B Household, sexual, or needle sharing contact of a person infected with Hep B: No Sexual contact with a person who uses drugs not as prescribed?: No Currently or Ever used drugs not as prescribed: No HIV Positive: No PRep Patient: No Men who have sex with men: No Have Hepatitis C: No History of Incarceration: No History of Homeslessness?: No Anal sex following anal drug use?: No Risk Factors for Hep C Currently using drugs not as prescribed: No Sexual partner(s) currently using drugs as not prescribed: No History of drug use: No HIV Positive: No People with a history of incarceration: No People born between the years of 72 and 43: No Counseling Patient counseled to use condoms with all sex: Condoms declined RTC in 2-3 weeks for test results: Yes Clinic will call if test results abnormal before test result appt.: Yes Test results given to patient Patient counseled to use condoms with all sex: Condoms declined   Screening for MPX risk:  Unexplained rash?  No   MSM?  No   Multiple or anonymous sex partners?  No   Any close or sexual contact with a person  diagnosed with MPX?  No   Any outside the [place]  where MPX is endemic?  No   High clinical suspicion for MPX?    -Unlikely to be chickenpox    -Lymphadenopathy    -Rash that presents in same phase of       evolution on any given body part  No   Does this patient meet CDC recommendations for vaccination against MPOX? No  You already have or anticipate having  the following risks:  Your sex partner has the following risks: You're traveling to a county with a clade I MPOX outbreak and anticipate these risks: Occupational exposure  You had known or suspected exposure to someone with monkeypox You had a sex partner in the past 2 weeks who was diagnosed with monkeypox You are a gay, bisexual, or other man who has sex with men, or are transgender or nonbinary and in the past 6 months have had any of the following: - A new diagnosis  of one or more sexually transmitted diseases (e.g., chlamydia, gonorrhea, or syphilis) - More than one sex partner You have had any of the following in the past 6 months: - Sex at a commercial sex venue (like a sex club or bathhouse) - Sex related to a large commercial event   or in a geographic area (city or county for example) where mpox virus transmission is occurring Sex with a new partner Sex at a commercial sex venue (e.g., a sex club or bathhouse) Sex in it consultant for money, goods, drugs, or other trade Sex in association with a large public event (e.g., a rave, party, or festival) i.e. certain people who work in a laboratory or healthcare facility   Infectious disease screenings: Vaccinated against HPV? Yes  HIV Ever had a positive? No Last test: 2025 Results in chart:  Lab Results  Component Value Date   HMHIVSCREEN Negative - Validated 01/26/2024    Lab Results  Component Value Date   HIV Non Reactive 05/05/2017     Hep B Hep B status: negative 2023 Received HBV vaccination? Unknown Received HBV testing for immunity? Yes Results in chart:  No components found for: HMHEPBSCREEN  Do they qualify for HBV screening today? No Criteria:  -Household, sexual or needle sharing contact with HBV -History of drug use or homelessness -HIV positive -Those with known Hep C  Hep C Hep C status: negative on 2023 Results in chart:  Lab Results  Component Value Date   HMHEPCSCREEN Negative-Validated 04/16/2022   No components found for: HEPC  Do they qualify for HCV screening today? No Criteria - since the last HCV result, does the patient have any of the following? - Current drug use - Have a partner with drug use - Has been incarcerated  Immunization history:  Immunization History  Administered Date(s) Administered   HPV Quadrivalent 07/02/2006   Hepatitis B 04/23/2001, 05/28/2001, 10/15/2001   Hpv-Unspecified 11/04/2005   Influenza,inj,Quad PF,6+ Mos 06/22/2015    MMR 11/13/1991, 12/25/1993   Tdap 03/24/2021    The following portions of the patient's history were reviewed and updated as appropriate: allergies, current medications, past medical history, past social history, past surgical history and problem list.  Substance use screenings:  Uses tobacco products? Yes, wants to quit, discussed quit line Uses vapes? No Uses non-injectable substances that alter your mental status? No Uses non-prescribed injectable substances? No  Objective:  There were no vitals filed for this visit.  Physical Exam Vitals and nursing note reviewed. Chaperone present: declined chaperone.  Constitutional:      Appearance: Normal appearance.  HENT:     Head: Normocephalic and atraumatic.     Comments: No nits or hair loss of scalp, brows, and lashes    Mouth/Throat:     Mouth: Mucous membranes are moist.     Pharynx: Oropharynx is clear. No oropharyngeal exudate or posterior oropharyngeal erythema.  Eyes:     General:        Right eye:  No discharge.        Left eye: No discharge.     Conjunctiva/sclera: Conjunctivae normal.     Right eye: Right conjunctiva is not injected.     Left eye: Left conjunctiva is not injected.  Pulmonary:     Effort: Pulmonary effort is normal.  Abdominal:     Tenderness: There is no rebound.  Genitourinary:    General: Normal vulva.     Exam position: Lithotomy position.     Pubic Area: No rash or pubic lice.      Labia:        Right: No rash or lesion.        Left: No rash or lesion.      Vagina: Normal. No vaginal discharge, erythema or lesions.     Cervix: No cervical motion tenderness, discharge, lesion or erythema.  Lymphadenopathy:     Cervical: No cervical adenopathy.     Upper Body:     Right upper body: No supraclavicular, axillary or epitrochlear adenopathy.     Left upper body: No supraclavicular, axillary or epitrochlear adenopathy.     Lower Body: No right inguinal adenopathy. No left inguinal adenopathy.   Skin:    General: Skin is warm and dry.     Findings: No lesion or rash.  Neurological:     Mental Status: She is alert and oriented to person, place, and time.    Assessment and Plan:  Abigail [name] is a 33 y.o. female presenting to the [facility] Department for STI screening.  Patient accepted the following screenings: oral GC culture, vaginal CT/GC swab, vaginal wet prep, HIV, and RPR  1. Screening for venereal disease (Primary)  - WET PREP FOR TRICH, YEAST, CLUE - Chlamydia/Gonorrhea [place] [facility] - HIV [place] [facility] - Syphilis Serology,  Lab - Gonococcus culture   Counseling: Discussed time line for [facility] results and that patient will be called with positive results and encouraged patient to call if they had not heard in 2 weeks.  Counseled to return or seek care for continued or worsening symptoms Recommended repeat testing in 3 months with positive results. Recommended condom use with all sex for STI prevention.   Return for STI testing as needed.  No future appointments.  Damien FORBES Satchel, NP "

## 2024-07-23 LAB — GONOCOCCUS CULTURE
